# Patient Record
Sex: Male | Born: 1980 | Hispanic: No | Marital: Married | Smoking: Never smoker
Health system: Southern US, Community
[De-identification: ages and names within clinical notes are randomized; demographics above are authoritative.]

## PROBLEM LIST (undated history)

## (undated) HISTORY — PX: SHOULDER SURGERY: SHX246

## (undated) HISTORY — PX: FINGER SURGERY: SHX640

---

## 2017-08-08 ENCOUNTER — Encounter (HOSPITAL_COMMUNITY): Payer: Self-pay | Admitting: Emergency Medicine

## 2017-08-08 ENCOUNTER — Emergency Department (HOSPITAL_COMMUNITY)
Admission: EM | Admit: 2017-08-08 | Discharge: 2017-08-08 | Disposition: A | Discharge status: Home / Self Care | Payer: Self-pay | Attending: Emergency Medicine | Admitting: Emergency Medicine

## 2017-08-08 ENCOUNTER — Emergency Department (HOSPITAL_COMMUNITY): Payer: Self-pay

## 2017-08-08 DIAGNOSIS — R142 Eructation: Secondary | ICD-10-CM | POA: Insufficient documentation

## 2017-08-08 DIAGNOSIS — R0789 Other chest pain: Secondary | ICD-10-CM | POA: Insufficient documentation

## 2017-08-08 LAB — CBC
HCT: 44.5 % (ref 39.0–52.0)
Hemoglobin: 15.4 g/dL (ref 13.0–17.0)
MCH: 30.7 pg (ref 26.0–34.0)
MCHC: 34.6 g/dL (ref 30.0–36.0)
MCV: 88.8 fL (ref 78.0–100.0)
Platelets: 271 10*3/uL (ref 150–400)
RBC: 5.01 MIL/uL (ref 4.22–5.81)
RDW: 13.7 % (ref 11.5–15.5)
WBC: 7.3 10*3/uL (ref 4.0–10.5)

## 2017-08-08 LAB — I-STAT TROPONIN, ED: Troponin i, poc: 0 ng/mL (ref 0.00–0.08)

## 2017-08-08 LAB — BASIC METABOLIC PANEL
Anion gap: 4 — ABNORMAL LOW (ref 5–15)
BUN: 27 mg/dL — ABNORMAL HIGH (ref 6–20)
CO2: 29 mmol/L (ref 22–32)
Calcium: 9.6 mg/dL (ref 8.9–10.3)
Chloride: 105 mmol/L (ref 101–111)
Creatinine, Ser: 1 mg/dL (ref 0.61–1.24)
GFR calc Af Amer: >60 mL/min (ref >60)
GFR calc non Af Amer: >60 mL/min (ref >60)
Glucose, Bld: 101 mg/dL — ABNORMAL HIGH (ref 65–99)
Potassium: 4.3 mmol/L (ref 3.5–5.1)
Sodium: 138 mmol/L (ref 135–145)

## 2017-08-08 MED ORDER — OMEPRAZOLE 20 MG PO CPDR: 20.0000 mg | DELAYED_RELEASE_CAPSULE | Freq: Every day | ORAL | 0 refills | Status: DC

## 2017-08-08 NOTE — ED Notes (Signed)
Discharge instructions reviewed with patient, at request of patient care nurse Apolonio Schneiders. Patient verbalizes understanding. VSS. PIV removed- catheter intact.

## 2017-08-08 NOTE — ED Provider Notes (Signed)
[facility] DEPT Provider Note   CSN: [id number] Arrival date & time: 08/08/17  1030     History   Chief Complaint Chief Complaint  Patient presents with  . Chest Pain    HPI Kevin Black is a 37 y.o. male with no significant past medical history who presents the emergency department today for chest pain.  Patient states that one week ago he was in a heated argument with a neighbor when he started developing a central chest pain without radiation to his back, jaw, neck or shoulders.  He stated the pain was burning in sensation and rated as a 8/10.  He said the pain caused him to hyperventilate from the pain.  He denies any nausea or associated diaphoresis.  Pain lasted approximately 30 minutes before going away.  Since then the patient has been having similar pains in the central chest described as burning without radiation or associated nausea, diaphoresis, or shortness of breath.  These typically occur when the patient is at rest or after periods of eating.  He notes that he has been having episodes of burping and belching associated with this that help with his symptoms.  The pain is nonexertional, nonpleuritic and non-positional. It typically lasts ~45 minutes. Last episode was yesterday night. He has no increased in activity recently.  He has not taken anything for symptoms.  The patient is a never smoker.  He has family history of any early cardiac death.  He has never had a stress test, echocardiogram or heart catheterization done before. He is chest pain free currently.  HPI  History reviewed. No pertinent past medical history.  There are no active problems to display for this patient.   Past Surgical History:  Procedure Laterality Date  . FINGER SURGERY    . SHOULDER SURGERY Right        Home Medications    Prior to Admission medications   Not on File    Family History No family history on file.  Social History Social History    Tobacco Use  . Smoking status: Never Smoker  . Smokeless tobacco: Never Used  Substance Use Topics  . Alcohol use: Yes  . Drug use: Not on file     Allergies   Patient has no known allergies.   Review of Systems Review of Systems  All other systems reviewed and are negative.    Physical Exam Updated Vital Signs BP (!) 146/91 (BP Location: Left Arm)   Pulse 76   Temp 97.9 F (36.6 C) (Oral)   Resp 15   SpO2 100%   Physical Exam  Constitutional: He appears well-developed and well-nourished.  HENT:  Head: Normocephalic and atraumatic.  Right Ear: External ear normal.  Left Ear: External ear normal.  Nose: Nose normal.  Mouth/Throat: Uvula is midline, oropharynx is clear and moist and mucous membranes are normal. No tonsillar exudate.  Eyes: Pupils are equal, round, and reactive to light. Right eye exhibits no discharge. Left eye exhibits no discharge. No scleral icterus.  Neck: Trachea normal. Neck supple. No JVD present. No spinous process tenderness present. Carotid bruit is not present. No neck rigidity. Normal range of motion present.  Cardiovascular: Normal rate, regular rhythm and intact distal pulses.  No murmur heard. Pulses:      Radial pulses are 2+ on the right side, and 2+ on the left side.       Dorsalis pedis pulses are 2+ on the right side, and 2+ on the  left side.       Posterior tibial pulses are 2+ on the right side, and 2+ on the left side.  No lower extremity swelling or edema. Calves symmetric in size bilaterally.  Pulmonary/Chest: Effort normal and breath sounds normal. He exhibits no tenderness.  No chest wall pain or crepitus.   Abdominal: Soft. Bowel sounds are normal. There is no tenderness. There is no rebound and no guarding.  Musculoskeletal: He exhibits no edema.  Lymphadenopathy:    He has no cervical adenopathy.  Neurological: He is alert.  Skin: Skin is warm and dry. No rash noted. He is not diaphoretic.  Psychiatric: He has a  normal mood and affect.  Nursing note and vitals reviewed.    ED Treatments / Results  Labs (all labs ordered are listed, but only abnormal results are displayed) Labs Reviewed  BASIC METABOLIC PANEL - Abnormal; Notable for the following components:      Result Value   Glucose, Bld 101 (*)    BUN 27 (*)    Anion gap 4 (*)    All other components within normal limits  CBC  I-STAT TROPONIN, ED    EKG  EKG Interpretation  Date/Time:  Monday August 08 2017 12:06:50 EST Ventricular Rate:  68 PR Interval:    QRS Duration: 95 QT Interval:  405 QTC Calculation: 431 R Axis:   72 Text Interpretation:  Sinus rhythm Confirmed by Virgel Manifold [phone]) on 08/08/2017 1:27:04 PM       Radiology Dg Chest 2 View  Result Date: 08/08/2017 CLINICAL DATA:  Chest pain EXAM: CHEST  2 VIEW COMPARISON:  None. FINDINGS: Lungs are clear. Heart size and pulmonary vascularity are normal. No adenopathy. No pneumothorax. No bone lesions. IMPRESSION: No edema or consolidation. Electronically Signed   By: Lowella Grip III M.D.   On: 08/08/2017 13:19    Procedures Procedures (including critical care time)  Medications Ordered in ED Medications - No data to display   Initial Impression / Assessment and Plan / ED Course  I have reviewed the triage vital signs and the nursing notes.  Pertinent labs & imaging results that were available during my care of the patient were reviewed by me and considered in my medical decision making (see chart for details).     Patient presented with chest pain to the ED. Patient is to be discharged with recommendation to follow up with PCP in regards to today's hospital visit. Chest pain is not likely of cardiac or pulmonary etiology due to presentation, perc negative, stable vital signs, no tracheal deviation, no JVD or new murmur, RRR, breath sounds equal bilaterally, EKG without acute abnormalities, negative troponin, and negative CXR. HEART score is 1. Patient  has been advised to return to the ED if chest pain becomes exertional, associated with diaphoresis or nausea, radiates to left jaw/arm, worsens or becomes concerning in any way. Patient appears reliable for follow up and is agreeable to discharge. I advised the patient to follow-up with their primary care provider this week. I advised the patient to return to the emergency department with new or worsening symptoms or new concerns. The patient verbalized understanding and agreement with plan. Patient stable   Final Clinical Impressions(s) / ED Diagnoses   Final diagnoses:  Atypical chest pain    ED Discharge Orders    None       Lorelle Gibbs 08/08/17 1330    Virgel Manifold, MD 08/08/17 1630

## 2017-08-08 NOTE — ED Triage Notes (Signed)
Patient reports that 2 days ago he got really anger and yelling at someone and had central chest pains since that have been intermittent. Patient reports that is burning sensations and denies getting worse with movement.

## 2017-08-08 NOTE — Discharge Instructions (Signed)
Read instructions below for reasons to return to the Emergency Department. It is recommended that your follow up with your Primary Care Doctor in regards to today's visit. If you do not have a doctor, use the resource guide listed below to help you find one.  Begin taking over the counter Prilosec or Zegrid (omeprazole) as directed.   Tests performed today include: An EKG of your heart A chest x-ray Cardiac enzymes - a blood test for heart muscle damage Blood counts and electrolytes Vital signs. See below for your results today.   Chest Pain (Nonspecific)  HOME CARE INSTRUCTIONS  For the next few days, avoid physical activities that bring on chest pain. Continue physical activities as directed.  Do not smoke cigarettes or drink alcohol until your symptoms are gone. If you do smoke, it is time to quit. You may receive instructions and counseling on how to stop smoking. Only take over-the-counter or prescription medicine for pain, discomfort, or fever as directed by your caregiver.  Follow your caregiver's suggestions for further testing if your chest pain does not go away.  Keep any follow-up appointments you made. If you do not go to an appointment, you could develop lasting (chronic) problems with pain. If there is any problem keeping an appointment, you must call to reschedule.  SEEK MEDICAL CARE IF:  You think you are having problems from the medicine you are taking. Read your medicine instructions carefully.  Your chest pain does not go away, even after treatment.  You develop a rash with blisters on your chest.  SEEK IMMEDIATE MEDICAL CARE IF:  You have increased chest pain or pain that spreads to your arm, neck, jaw, back, or belly (abdomen).  You develop shortness of breath, an increasing cough, or you are coughing up blood.  You have severe back or abdominal pain, feel sick to your stomach (nauseous) or throw up (vomit).  You develop severe weakness, fainting, or chills.  You have an  oral temperature above 102 F (38.9 C), not controlled by medicine.  THIS IS AN EMERGENCY. Do not wait to see if the pain will go away. Get medical help at once. Call your local emergency services (911 in [place] Do not drive yourself to the hospital. Additional Information:  Your vital signs today were: BP (!) 136/92 (BP Location: Left Arm)    Pulse 75    Temp (!) 97.5 F (36.4 C) (Oral)    Resp 18    Ht 6\' 2"  (1.88 m)    Wt 90.7 kg (200 lb)    SpO2 98%    BMI 25.68 kg/m  If your blood pressure (BP) was elevated above 135/85 this visit, please have this repeated by your doctor within one month. ---------------

## 2018-04-19 ENCOUNTER — Telehealth: Payer: Self-pay | Admitting: Emergency Medicine

## 2018-04-19 NOTE — Telephone Encounter (Signed)
Copied from [facility] [id number]. Topic: Appointment Scheduling - New Patient >> Apr 19, 2018  9:46 AM Mylinda Latina, NT wrote: New patient has been scheduled for your office. Provider: Dr. Ethelene Hal Date of Appointment: 04/25/18  Route to department's PEC pool.

## 2018-04-25 ENCOUNTER — Encounter: Payer: Self-pay | Admitting: Family Medicine

## 2018-04-25 ENCOUNTER — Ambulatory Visit (INDEPENDENT_AMBULATORY_CARE_PROVIDER_SITE_OTHER): Payer: No Typology Code available for payment source | Admitting: Family Medicine

## 2018-04-25 VITALS — BP 124/80 | HR 79 | Ht 74.0 in | Wt 221.1 lb

## 2018-04-25 DIAGNOSIS — Z0000 Encounter for general adult medical examination without abnormal findings: Secondary | ICD-10-CM

## 2018-04-25 HISTORY — DX: Encounter for general adult medical examination without abnormal findings: Z00.00

## 2018-04-25 NOTE — Progress Notes (Signed)
Subjective:  Patient ID: Kevin Black, male    DOB: May 03, 1981  Age: 37 y.o. MRN: [id number]  CC: Establish Care   HPI Kevin Black presents for a physical exam.  He is not fasting today.  Patient does not smoke or use illicit drugs.  He drinks 1 alcoholic drink daily.  Sometimes a few more than that on the weekends.  He lives with his wife and 3 children oldest is 26 and then her youngest is 18.  He works in a store.  Father passed from an acute asthma attack.  Mom has hypertension that is treated.  She has a history of some type of septal defect that she has had from birth.  Patient reports difficulty eating pork and red meat.  There is some stomach pain.  He has no rash or swelling from this.  Denies ongoing reflux symptoms.  Kevin Black does not bother him but other spicy or cuts pork do.  He has essentially removed most pork and red meat from his diet.  History Kevin Black has no past medical history on file.   He has a past surgical history that includes Shoulder surgery (Right) and Finger surgery.   His family history is not on file.He reports that he has never smoked. He has never used smokeless tobacco. He reports that he drinks alcohol. His drug history is not on file.  Outpatient Medications Prior to Visit  Medication Sig Dispense Refill  . aspirin 325 MG tablet Take 325 mg by mouth every 4 (four) hours as needed for moderate pain.    Marland Kitchen omeprazole (PRILOSEC) 20 MG capsule Take 1 capsule (20 mg total) by mouth daily. 30 capsule 0   No facility-administered medications prior to visit.     ROS Review of Systems  Constitutional: Negative for chills, diaphoresis, fatigue, fever and unexpected weight change.  HENT: Negative.   Eyes: Negative for photophobia and visual disturbance.  Respiratory: Negative.   Cardiovascular: Negative.   Gastrointestinal: Negative.   Endocrine: Negative for polyphagia and polyuria.  Genitourinary: Positive for decreased urine volume. Negative for difficulty  urinating and hematuria.  Musculoskeletal: Negative for arthralgias and myalgias.  Skin: Negative for pallor.  Allergic/Immunologic: Negative for immunocompromised state.  Neurological: Negative for headaches.  Hematological: Does not bruise/bleed easily.  Psychiatric/Behavioral: Negative.     Objective:  BP 124/80   Pulse 79   Ht 6\' 2"  (1.88 m)   Wt 221 lb 2 oz (100.3 kg)   SpO2 97%   BMI 28.39 kg/m   Physical Exam  Constitutional: He is oriented to person, place, and time. He appears well-developed and well-nourished. No distress.  HENT:  Head: Normocephalic and atraumatic.  Right Ear: External ear normal.  Left Ear: External ear normal.  Mouth/Throat: No oropharyngeal exudate.  Eyes: Pupils are equal, round, and reactive to light. Conjunctivae and EOM are normal. Right eye exhibits no discharge. Left eye exhibits no discharge. No scleral icterus.  Neck: No JVD present. No tracheal deviation present. No thyromegaly present.  Cardiovascular: Normal rate, regular rhythm and normal heart sounds.  Pulmonary/Chest: Effort normal and breath sounds normal.  Abdominal: Soft. Bowel sounds are normal. He exhibits no distension and no mass. There is no tenderness. There is no guarding. Hernia confirmed negative in the right inguinal area and confirmed negative in the left inguinal area.  Genitourinary: Testes normal and penis normal. Right testis shows no mass, no swelling and no tenderness. Right testis is descended. Left testis shows no mass, no swelling and  no tenderness. Left testis is descended. Circumcised. No hypospadias, penile erythema or penile tenderness. No discharge found.  Musculoskeletal: He exhibits no edema.  Lymphadenopathy:    He has no cervical adenopathy. No inguinal adenopathy noted on the right or left side.  Neurological: He is alert and oriented to person, place, and time.  Skin: Skin is warm and dry. He is not diaphoretic. No erythema.      Assessment & Plan:     Abdul was seen today for establish care.  Diagnoses and all orders for this visit:  Healthcare maintenance -     CBC; Future -     Comprehensive metabolic panel; Future -     HIV Antibody (routine testing w rflx); Future -     Lipid panel; Future -     Urinalysis, Routine w reflex microscopic; Future   I have discontinued Daytona Bonfanti's aspirin and omeprazole.  No orders of the defined types were placed in this encounter.    Follow-up: Return don't forget to return fasting for blood work.Libby Maw, MD

## 2018-04-27 ENCOUNTER — Other Ambulatory Visit (INDEPENDENT_AMBULATORY_CARE_PROVIDER_SITE_OTHER): Payer: No Typology Code available for payment source

## 2018-04-27 DIAGNOSIS — Z0000 Encounter for general adult medical examination without abnormal findings: Secondary | ICD-10-CM

## 2018-04-27 LAB — CBC
HEMATOCRIT: 45.3 % (ref 39.0–52.0)
HEMOGLOBIN: 15.4 g/dL (ref 13.0–17.0)
MCHC: 34.1 g/dL (ref 30.0–36.0)
MCV: 90.1 fl (ref 78.0–100.0)
Platelets: 300 10*3/uL (ref 150.0–400.0)
RBC: 5.02 Mil/uL (ref 4.22–5.81)
RDW: 13.6 % (ref 11.5–15.5)
WBC: 7.5 10*3/uL (ref 4.0–10.5)

## 2018-04-27 LAB — LIPID PANEL
CHOLESTEROL: 222 mg/dL — AB (ref 0–200)
HDL: 62.1 mg/dL (ref >39.00)
LDL CALC: 141 mg/dL — AB (ref 0–99)
NonHDL: 159.83
TRIGLYCERIDES: 94 mg/dL (ref 0.0–149.0)
Total CHOL/HDL Ratio: 4
VLDL: 18.8 mg/dL (ref 0.0–40.0)

## 2018-04-27 LAB — URINALYSIS, ROUTINE W REFLEX MICROSCOPIC
Bilirubin Urine: NEGATIVE
Hgb urine dipstick: NEGATIVE
Ketones, ur: NEGATIVE
LEUKOCYTES UA: NEGATIVE
NITRITE: NEGATIVE
SPECIFIC GRAVITY, URINE: 1.02 (ref 1.000–1.030)
Total Protein, Urine: NEGATIVE
Urine Glucose: NEGATIVE
Urobilinogen, UA: 0.2 (ref 0.0–1.0)
WBC UA: NONE SEEN — AB (ref 0–?)
pH: 6.5 (ref 5.0–8.0)

## 2018-04-27 LAB — COMPREHENSIVE METABOLIC PANEL
ALBUMIN: 4.5 g/dL (ref 3.5–5.2)
ALT: 36 U/L (ref 0–53)
AST: 25 U/L (ref 0–37)
Alkaline Phosphatase: 69 U/L (ref 39–117)
BUN: 13 mg/dL (ref 6–23)
CALCIUM: 9.7 mg/dL (ref 8.4–10.5)
CHLORIDE: 102 meq/L (ref 96–112)
CO2: 31 mEq/L (ref 19–32)
CREATININE: 1.02 mg/dL (ref 0.40–1.50)
GFR: 86.54 mL/min (ref >60.00)
Glucose, Bld: 98 mg/dL (ref 70–99)
POTASSIUM: 3.9 meq/L (ref 3.5–5.1)
Sodium: 137 mEq/L (ref 135–145)
Total Bilirubin: 0.7 mg/dL (ref 0.2–1.2)
Total Protein: 7.8 g/dL (ref 6.0–8.3)

## 2018-04-28 LAB — HIV ANTIBODY (ROUTINE TESTING W REFLEX): HIV 1&2 Ab, 4th Generation: NONREACTIVE

## 2018-08-21 ENCOUNTER — Encounter: Payer: Self-pay | Admitting: Family Medicine

## 2018-08-21 ENCOUNTER — Ambulatory Visit (INDEPENDENT_AMBULATORY_CARE_PROVIDER_SITE_OTHER): Payer: No Typology Code available for payment source | Admitting: Family Medicine

## 2018-08-21 VITALS — BP 120/70 | HR 74 | Ht 74.0 in | Wt 218.0 lb

## 2018-08-21 DIAGNOSIS — D229 Melanocytic nevi, unspecified: Secondary | ICD-10-CM

## 2018-08-21 DIAGNOSIS — E7800 Pure hypercholesterolemia, unspecified: Secondary | ICD-10-CM

## 2018-08-21 DIAGNOSIS — R319 Hematuria, unspecified: Secondary | ICD-10-CM

## 2018-08-21 DIAGNOSIS — Z789 Other specified health status: Secondary | ICD-10-CM

## 2018-08-21 DIAGNOSIS — F1090 Alcohol use, unspecified, uncomplicated: Secondary | ICD-10-CM

## 2018-08-21 DIAGNOSIS — B360 Pityriasis versicolor: Secondary | ICD-10-CM

## 2018-08-21 DIAGNOSIS — Z7289 Other problems related to lifestyle: Secondary | ICD-10-CM | POA: Diagnosis not present

## 2018-08-21 HISTORY — DX: Pure hypercholesterolemia, unspecified: E78.00

## 2018-08-21 HISTORY — DX: Pityriasis versicolor: B36.0

## 2018-08-21 HISTORY — DX: Alcohol use, unspecified, uncomplicated: F10.90

## 2018-08-21 HISTORY — DX: Other specified health status: Z78.9

## 2018-08-21 HISTORY — DX: Melanocytic nevi, unspecified: D22.9

## 2018-08-21 HISTORY — DX: Hematuria, unspecified: R31.9

## 2018-08-21 LAB — LIPID PANEL
CHOLESTEROL: 234 mg/dL — AB (ref 0–200)
HDL: 56.3 mg/dL (ref >39.00)
LDL CALC: 165 mg/dL — AB (ref 0–99)
NonHDL: 177.4
Total CHOL/HDL Ratio: 4
Triglycerides: 63 mg/dL (ref 0.0–149.0)
VLDL: 12.6 mg/dL (ref 0.0–40.0)

## 2018-08-21 LAB — URINALYSIS, ROUTINE W REFLEX MICROSCOPIC
HGB URINE DIPSTICK: NEGATIVE
Ketones, ur: NEGATIVE
NITRITE: NEGATIVE
RBC / HPF: NONE SEEN (ref 0–?)
Specific Gravity, Urine: 1.025 (ref 1.000–1.030)
Total Protein, Urine: NEGATIVE
Urine Glucose: NEGATIVE
Urobilinogen, UA: 0.2 (ref 0.0–1.0)
pH: 6 (ref 5.0–8.0)

## 2018-08-21 LAB — CBC
HCT: 45.4 % (ref 39.0–52.0)
HEMOGLOBIN: 15.6 g/dL (ref 13.0–17.0)
MCHC: 34.4 g/dL (ref 30.0–36.0)
MCV: 88.8 fl (ref 78.0–100.0)
PLATELETS: 307 10*3/uL (ref 150.0–400.0)
RBC: 5.11 Mil/uL (ref 4.22–5.81)
RDW: 13.7 % (ref 11.5–15.5)
WBC: 7.2 10*3/uL (ref 4.0–10.5)

## 2018-08-21 LAB — HEPATIC FUNCTION PANEL
ALBUMIN: 4.5 g/dL (ref 3.5–5.2)
ALK PHOS: 75 U/L (ref 39–117)
ALT: 68 U/L — ABNORMAL HIGH (ref 0–53)
AST: 36 U/L (ref 0–37)
BILIRUBIN DIRECT: 0.1 mg/dL (ref 0.0–0.3)
Total Bilirubin: 0.6 mg/dL (ref 0.2–1.2)
Total Protein: 7.4 g/dL (ref 6.0–8.3)

## 2018-08-21 LAB — GAMMA GT: GGT: 45 U/L (ref 7–51)

## 2018-08-21 MED ORDER — SELENIUM SULFIDE 2.5 % EX LOTN: TOPICAL_LOTION | CUTANEOUS | 5 refills | Status: DC

## 2018-08-21 NOTE — Patient Instructions (Signed)
Preventing High Cholesterol  Cholesterol is a waxy, fat-like substance that your body needs in small amounts. Your liver makes all the cholesterol that your body needs. Having high cholesterol (hypercholesterolemia) increases your risk for heart disease and stroke. Extra (excess) cholesterol comes from the food you eat, such as animal-based fat (saturated fat) from meat and some dairy products.  High cholesterol can often be prevented with diet and lifestyle changes. If you already have high cholesterol, you can control it with diet and lifestyle changes, as well as medicine.  What nutrition changes can be made?   Eat less saturated fat. Foods that contain saturated fat include red meat and some dairy products.   Avoid processed meats, like bacon and lunch meats.   Avoid trans fats, which are found in margarine and some baked goods.   Avoid foods and beverages that have added sugars.   Eat more fruits, vegetables, and whole grains.   Choose healthy sources of protein, such as fish, poultry, and nuts.   Choose healthy sources of fat, such as:  ? Nuts.  ? Vegetable oils, especially olive oil.  ? Fish that have healthy fats (omega-3 fatty acids), such as mackerel or salmon.  What lifestyle changes can be made?     Lose weight if you are overweight. Losing 5-10 lb (2.3-4.5 kg) can help prevent or control high cholesterol and reduce your risk for diabetes and high blood pressure. Ask your health care provider to help you with a diet and exercise plan to safely lose weight.   Get enough exercise. Do at least 150 minutes of moderate-intensity exercise each week.  ? You could do this in short exercise sessions several times a day, or you could do longer exercise sessions a few times a week. For example, you could take a brisk 10-minute walk or bike ride, 3 times a day, for 5 days a week.   Do not smoke. If you need help quitting, ask your health care provider.   Limit your alcohol intake. If you drink  alcohol, limit alcohol intake to no more than 1 drink a day for nonpregnant women and 2 drinks a day for men. One drink equals 12 oz of beer, 5 oz of wine, or 1 oz of hard liquor.  Why are these changes important?    If you have high cholesterol, deposits (plaques) may build up on the walls of your blood vessels. Plaques make the arteries narrower and stiffer, which can restrict or block blood flow and cause blood clots to form. This greatly increases your risk for heart attack and stroke. Making diet and lifestyle changes can reduce your risk for these life-threatening conditions.  What can I do to lower my risk?   Manage your risk factors for high cholesterol. Talk with your health care provider about all of your risk factors and how to lower your risk.   Manage other conditions that you have, such as diabetes or high blood pressure (hypertension).   Have your cholesterol checked at regular intervals.   Keep all follow-up visits as told by your health care provider. This is important.  How is this treated?  In addition to diet and lifestyle changes, your health care provider may recommend medicines to help lower cholesterol, such as a medicine to reduce the amount of cholesterol made in your liver. You may need medicine if:   Diet and lifestyle changes do not lower your cholesterol enough.   You have high cholesterol and other risk factors   and Blood Institute: [email or web address] Summary  High cholesterol increases your risk for heart disease and stroke. By keeping your cholesterol level low, you can reduce your risk for these conditions.  Diet and lifestyle changes are  the most important steps in preventing high cholesterol.  Work with your health care provider to manage your risk factors, and have your blood tested regularly. This information is not intended to replace advice given to you by your health care provider. Make sure you discuss any questions you have with your health care provider. Document Released: 07/27/2015 Document Revised: 03/20/2016 Document Reviewed: 03/20/2016 Elsevier Interactive Patient Education  2019 [place] versicolor is a common fungal infection of the skin. It causes a rash that appears as light or dark patches on the skin. The rash most often occurs on the chest, back, neck, or upper arms. This condition is more common during warm weather. Other than affecting how your skin looks, tinea versicolor usually does not cause other problems. In most cases, the infection goes away in a few weeks with treatment. It may take a few months for the patches on your skin to return to your usual skin color. What are the causes? This condition occurs when a type of fungus that is normally present on the skin starts to overgrow. This fungus is a kind of yeast. The exact cause of the overgrowth is not known. This condition cannot be passed from one person to another (it is not contagious). What increases the risk? This condition is more likely to develop when certain factors are present, such as:  Heat and humidity.  Sweating too much.  Hormone changes.  Oily skin.  A weak disease-fighting system (immunesystem). What are the signs or symptoms? Symptoms of this condition include:  A rash of light or dark patches on your skin. The rash may have: ? Patches of tan or pink spots (on light skin). ? Patches of white or brown spots (on dark skin). ? Patches of skin that do not tan. ? Well-marked edges. ? Scales on the discolored areas.  Mild itching. How is this diagnosed? A health care provider can usually  diagnose this condition by looking at your skin. During the exam, he or she may use ultraviolet (UV) light to see how much of your skin has been affected. In some cases, a skin sample may be taken by scraping the rash. This sample will be viewed under a microscope to check for yeast overgrowth. How is this treated? Treatment for this condition may include:  Dandruff shampoo that is applied to the affected skin during showers or bathing.  Over-the-counter medicated skin cream, lotion, or soaps.  Prescription antifungal medicine in the form of skin cream or pills.  Medicine to help reduce itching. Follow these instructions at home:  Take over-the-counter and prescription medicines only as told by your health care provider.  Apply dandruff shampoo to the affected area if your health care provider told you to do that. You may be instructed to scrub the affected skin for several minutes each day.  Do not scratch the affected area of skin.  Avoid hot and humid conditions.  Do not use tanning booths.  Try to avoid sweating a lot. Contact a health care provider if:  Your symptoms get worse.  You have a fever.  You have redness, swelling, or pain at the site of your rash.  You have fluid or blood coming from your rash.  Your rash feels warm  to the touch.  You have pus or a bad smell coming from your rash.  Your rash returns (recurs) after treatment. Summary  Tinea versicolor is a common fungal infection of the skin. It causes a rash that appears as light or dark patches on the skin.  The rash most often occurs on the chest, back, neck, or upper arms.  A health care provider can usually diagnose this condition by looking at your skin.  Treatment may include applying shampoo to the skin and taking or applying medicines. This information is not intended to replace advice given to you by your health care provider. Make sure you discuss any questions you have with your health care  provider. Document Released: 07/09/2000 Document Revised: 03/15/2017 Document Reviewed: 03/15/2017 Elsevier Interactive Patient Education  2019 [employer]  Alcohol Use Disorder Alcohol use disorder is when your drinking disrupts your daily life. When you have this condition, you drink too much alcohol and you cannot control your drinking. Alcohol use disorder can cause serious problems with your physical health. It can affect your brain, heart, liver, pancreas, immune system, stomach, and intestines. Alcohol use disorder can increase your risk for certain cancers and cause problems with your mental health, such as depression, anxiety, psychosis, delirium, and dementia. People with this disorder risk hurting themselves and others. What are the causes? This condition is caused by drinking too much alcohol over time. It is not caused by drinking too much alcohol only one or two times. Some people with this condition drink alcohol to cope with or escape from negative life events. Others drink to relieve pain or symptoms of mental illness. What increases the risk? You are more likely to develop this condition if:  You have a family history of alcohol use disorder.  Your culture encourages drinking to the point of intoxication, or makes alcohol easy to get.  You had a mood or conduct disorder in childhood.  You have been a victim of abuse.  You are an adolescent and: ? You have poor grades or difficulties in school. ? Your caregivers do not talk to you about saying no to alcohol, or supervise your activities. ? You are impulsive or you have trouble with self-control. What are the signs or symptoms? Symptoms of this condition include:  Drinkingmore than you want to.  Drinking for longer than you want to.  Trying several times to drink less or to control your drinking.  Spending a lot of time getting alcohol, drinking, or recovering from drinking.  Craving alcohol.  Having problems  at work, at school, or at home due to drinking.  Having problems in relationships due to drinking.  Drinking when it is dangerous to drink, such as before driving a car.  Continuing to drink even though you know you might have a physical or mental problem related to drinking.  Needing more and more alcohol to get the same effect you want from the alcohol (building up tolerance).  Having symptoms of withdrawal when you stop drinking. Symptoms of withdrawal include: ? Fatigue. ? Nightmares. ? Trouble sleeping. ? Depression. ? Anxiety. ? Fever. ? Seizures. ? Severe confusion. ? Feeling or seeing things that are not there (hallucinations). ? Tremors. ? Rapid heart rate. ? Rapid breathing. ? High blood pressure.  Drinking to avoid symptoms of withdrawal. How is this diagnosed? This condition is diagnosed with an assessment. Your health care provider may start the assessment by asking three or four questions about your drinking. Your health care  provider may perform a physical exam or do lab tests to see if you have physical problems resulting from alcohol use. She or he may refer you to a mental health professional for evaluation. How is this treated? Some people with alcohol use disorder are able to reduce their alcohol use to low-risk levels. Others need to completely quit drinking alcohol. When necessary, mental health professionals with specialized training in substance use treatment can help. Your health care provider can help you decide how severe your alcohol use disorder is and what type of treatment you need. The following forms of treatment are available:  Detoxification. Detoxification involves quitting drinking and using prescription medicines within the first week to help lessen withdrawal symptoms. This treatment is important for people who have had withdrawal symptoms before and for heavy drinkers who are likely to have withdrawal symptoms. Alcohol withdrawal can be  dangerous, and in severe cases, it can cause death. Detoxification may be provided in a home, community, or primary care setting, or in a hospital or substance use treatment facility.  Counseling. This treatment is also called talk therapy. It is provided by substance use treatment counselors. A counselor can address the reasons you use alcohol and suggest ways to keep you from drinking again or to prevent problem drinking. The goals of talk therapy are to: ? Find healthy activities and ways for you to cope with stress. ? Identify and avoid the things that trigger your alcohol use. ? Help you learn how to handle cravings.  Medicines.Medicines can help treat alcohol use disorder by: ? Decreasing alcohol cravings. ? Decreasing the positive feeling you have when you drink alcohol. ? Causing an uncomfortable physical reaction when you drink alcohol (aversion therapy).  Support groups. Support groups are led by people who have quit drinking. They provide emotional support, advice, and guidance. These forms of treatment are often combined. Some people with this condition benefit from a combination of treatments provided by specialized substance use treatment centers. Follow these instructions at home:  Take over-the-counter and prescription medicines only as told by your health care provider.  Check with your health care provider before starting any new medicines.  Ask friends and family members not to offer you alcohol.  Avoid situations where alcohol is served, including gatherings where others are drinking alcohol.  Create a plan for what to do when you are tempted to use alcohol.  Find hobbies or activities that you enjoy that do not include alcohol.  Keep all follow-up visits as told by your health care provider. This is important. How is this prevented?  If you drink, limit alcohol intake to no more than 1 drink a day for nonpregnant women and 2 drinks a day for men. One drink equals  12 oz of beer, 5 oz of wine, or 1 oz of hard liquor.  If you have a mental health condition, get treatment and support.  Do not give alcohol to adolescents.  If you are an adolescent: ? Do not drink alcohol. ? Do not be afraid to say no if someone offers you alcohol. Speak up about why you do not want to drink. You can be a positive role model for your friends and set a good example for those around you by not drinking alcohol. ? If your friends drink, spend time with others who do not drink alcohol. Make new friends who do not use alcohol. ? Find healthy ways to manage stress and emotions, such as meditation or deep breathing, exercise,  spending time in nature, listening to music, or talking with a trusted friend or family member. Contact a health care provider if:  You are not able to take your medicines as told.  Your symptoms get worse.  You return to drinking alcohol (relapse) and your symptoms get worse. Get help right away if:  You have thoughts about hurting yourself or others. If you ever feel like you may hurt yourself or others, or have thoughts about taking your own life, get help right away. You can go to your nearest emergency department or call:  Your local emergency services (911 in the [place]  A suicide crisis helpline, such as the [place] at 351-368-0637. This is open 24 hours a day. Summary  Alcohol use disorder is when your drinking disrupts your daily life. When you have this condition, you drink too much alcohol and you cannot control your drinking.  Treatment may include detoxification, counseling, medicine, and support groups.  Ask friends and family members not to offer you alcohol. Avoid situations where alcohol is served.  Get help right away if you have thoughts about hurting yourself or others. This information is not intended to replace advice given to you by your health care provider. Make sure you discuss any questions  you have with your health care provider. Document Released: 08/19/2004 Document Revised: 04/08/2016 Document Reviewed: 04/08/2016 Elsevier Interactive Patient Education  [employer].

## 2018-08-21 NOTE — Progress Notes (Signed)
Established Patient Office Visit  Subjective:  Patient ID: Kevin Black, male    DOB: September 05, 1980  Age: 38 y.o. MRN: [id number]  CC:  Chief Complaint  Patient presents with  . Hematuria    HPI Kevin Black presents for for concern about a darkening of his urine that he experienced this past week.  He went on to mid that he had been drinking heavily prior to this change in his urine.  He has not had anything to drink for a week.  Both he and his wife have been concerned about his drinking.  Does admit to eye-opener's but denies early morning shakes.  He tends to drink more heavily when he is off.  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 3/5 with [employer].  Denies a family history of alcoholism.  He does not smoke or use illicit drugs.  He has been decreasing amount of cholesterol in his diet.  He is fasting this morning.  He has a mole on his posterior left shoulder that he wants me to take a look at.  History reviewed. No pertinent past medical history.  Past Surgical History:  Procedure Laterality Date  . FINGER SURGERY    . SHOULDER SURGERY Right     History reviewed. No pertinent family history.  Social History   Socioeconomic History  . Marital status: Married    Spouse name: Not on file  . Number of children: Not on file  . Years of education: Not on file  . Highest education level: Not on file  Occupational History  . Not on file  Social Needs  . Financial resource strain: Not on file  . Food insecurity:    Worry: Not on file    Inability: Not on file  . Transportation needs:    Medical: Not on file    Non-medical: Not on file  Tobacco Use  . Smoking status: Never Smoker  . Smokeless tobacco: Never Used  Substance and Sexual Activity  . Alcohol use: Yes    Comment: one drink daily.  . Drug use: Not on file  . Sexual activity: Not on file  Lifestyle  . Physical activity:    Days per week: Not on file    Minutes per session: Not on file  . Stress: Not on file   Relationships  . Social connections:    Talks on phone: Not on file    Gets together: Not on file    Attends religious service: Not on file    Active member of club or organization: Not on file    Attends meetings of clubs or organizations: Not on file    Relationship status: Not on file  . Intimate partner violence:    Fear of current or ex partner: Not on file    Emotionally abused: Not on file    Physically abused: Not on file    Forced sexual activity: Not on file  Other Topics Concern  . Not on file  Social History Narrative  . Not on file    No outpatient medications prior to visit.   No facility-administered medications prior to visit.     No Known Allergies  ROS Review of Systems  Constitutional: Negative for chills, diaphoresis, fatigue, fever and unexpected weight change.  HENT: Negative.   Eyes: Negative.   Respiratory: Negative.   Cardiovascular: Negative.   Gastrointestinal: Negative for abdominal distention, abdominal pain, anal bleeding, blood in stool, nausea and vomiting.  Endocrine: Negative for polyphagia  and polyuria.  Genitourinary: Negative for decreased urine volume, discharge and dysuria.  Musculoskeletal: Negative for gait problem and joint swelling.  Skin: Positive for color change.  Allergic/Immunologic: Negative for immunocompromised state.  Neurological: Negative for tremors, seizures and weakness.  Hematological: Does not bruise/bleed easily.      Objective:    Physical Exam  Constitutional: He is oriented to person, place, and time. He appears well-developed and well-nourished. No distress.  HENT:  Head: Normocephalic and atraumatic.  Right Ear: External ear normal.  Left Ear: External ear normal.  Mouth/Throat: Oropharynx is clear and moist. No oropharyngeal exudate.  Eyes: Pupils are equal, round, and reactive to light. Conjunctivae are normal. Right eye exhibits no discharge. Left eye exhibits no discharge. No scleral icterus.    Neck: Neck supple. No JVD present. No tracheal deviation present. No thyromegaly present.  Cardiovascular: Normal rate, regular rhythm and normal heart sounds.  Pulmonary/Chest: Effort normal and breath sounds normal. No stridor.  Abdominal: Soft. Bowel sounds are normal. He exhibits no distension. There is no abdominal tenderness. There is no rebound and no guarding.  Lymphadenopathy:    He has no cervical adenopathy.  Neurological: He is alert and oriented to person, place, and time.  Skin: Skin is warm and dry. Rash noted. He is not diaphoretic. No pallor.     Psychiatric: He has a normal mood and affect. His behavior is normal.    BP 120/70   Pulse 74   Ht 6\' 2"  (1.88 m)   Wt 218 lb (98.9 kg)   SpO2 95%   BMI 27.99 kg/m  Wt Readings from Last 3 Encounters:  08/21/18 218 lb (98.9 kg)  04/25/18 221 lb 2 oz (100.3 kg)  08/08/17 200 lb (90.7 kg)   BP Readings from Last 3 Encounters:  08/21/18 120/70  04/25/18 124/80  08/08/17 (!) 158/86   Guideline developer:  UpToDate (see UpToDate for funding source) Date Released: June 2014  Health Maintenance Due  Topic Date Due  . Samul Dada  07/31/1998  . INFLUENZA VACCINE  02/23/2018    There are no preventive care reminders to display for this patient.  No results found for: TSH Lab Results  Component Value Date   WBC 7.5 04/27/2018   HGB 15.4 04/27/2018   HCT 45.3 04/27/2018   MCV 90.1 04/27/2018   PLT 300.0 04/27/2018   Lab Results  Component Value Date   NA 137 04/27/2018   K 3.9 04/27/2018   CO2 31 04/27/2018   GLUCOSE 98 04/27/2018   BUN 13 04/27/2018   CREATININE 1.02 04/27/2018   BILITOT 0.7 04/27/2018   ALKPHOS 69 04/27/2018   AST 25 04/27/2018   ALT 36 04/27/2018   PROT 7.8 04/27/2018   ALBUMIN 4.5 04/27/2018   CALCIUM 9.7 04/27/2018   ANIONGAP 4 (L) 08/08/2017   GFR 86.54 04/27/2018   Lab Results  Component Value Date   CHOL 222 (H) 04/27/2018   Lab Results  Component Value Date   HDL  62.10 04/27/2018   Lab Results  Component Value Date   LDLCALC 141 (H) 04/27/2018   Lab Results  Component Value Date   TRIG 94.0 04/27/2018   Lab Results  Component Value Date   CHOLHDL 4 04/27/2018   No results found for: HGBA1C    Assessment & Plan:   Problem List Items Addressed This Visit      Musculoskeletal and Integument   Tinea versicolor   Relevant Medications   selenium sulfide (SELSUN) 2.5 %  shampoo   Atypical nevus   Relevant Orders   Ambulatory referral to Dermatology     Other   Hematuria - Primary   Relevant Orders   Urinalysis, Routine w reflex microscopic   Alcohol use   Relevant Orders   Gamma GT   Urinalysis, Routine w reflex microscopic   Hepatic function panel   CBC   Elevated LDL cholesterol level   Relevant Orders   Lipid panel      Meds ordered this encounter  Medications  . selenium sulfide (SELSUN) 2.5 % shampoo    Sig: Apply daily to rash and leave on for 1 hour and then wash off for one week.    Dispense:  118 mL    Refill:  5    Follow-up: Return in about 2 months (around 10/20/2018).   Follow-up of elevated LDL cholesterol today.  Patient was also given information on lowering the fat and cholesterol in his diet.  Will use prescription strength selenium sulfide lotion for treatment of his skin rash.  Patient agrees to stop drinking for the next few months and follow-up with me 2 months as well.

## 2018-10-18 ENCOUNTER — Encounter: Payer: Self-pay | Admitting: Family Medicine

## 2018-10-23 ENCOUNTER — Ambulatory Visit: Payer: No Typology Code available for payment source | Admitting: Family Medicine

## 2018-12-25 ENCOUNTER — Encounter: Payer: Self-pay | Admitting: Family Medicine

## 2018-12-25 ENCOUNTER — Encounter: Payer: No Typology Code available for payment source | Admitting: Family Medicine

## 2018-12-25 NOTE — Progress Notes (Deleted)
Virtual Visit via Video Note  I connected with Kevin Black on 12/25/18 at  9:30 AM EDT by a video enabled telemedicine application and verified that I am speaking with the correct person using two identifiers.  Location: Patient: *** Provider: ***   I discussed the limitations of evaluation and management by telemedicine and the availability of in person appointments. The patient expressed understanding and agreed to proceed.  History of Present Illness:    Observations/Objective:   Assessment and Plan:   Follow Up Instructions:    I discussed the assessment and treatment plan with the patient. The patient was provided an opportunity to ask questions and all were answered. The patient agreed with the plan and demonstrated an understanding of the instructions.   The patient was advised to call back or seek an in-person evaluation if the symptoms worsen or if the condition fails to improve as anticipated.  I provided *** minutes of non-face-to-face time during this encounter.

## 2019-04-09 ENCOUNTER — Other Ambulatory Visit: Payer: Self-pay

## 2019-04-10 ENCOUNTER — Encounter: Payer: Self-pay | Admitting: Family Medicine

## 2019-04-10 ENCOUNTER — Ambulatory Visit (INDEPENDENT_AMBULATORY_CARE_PROVIDER_SITE_OTHER): Payer: No Typology Code available for payment source | Admitting: Family Medicine

## 2019-04-10 VITALS — BP 124/70 | HR 84 | Ht 74.0 in | Wt 225.4 lb

## 2019-04-10 DIAGNOSIS — R319 Hematuria, unspecified: Secondary | ICD-10-CM | POA: Diagnosis not present

## 2019-04-10 DIAGNOSIS — E7800 Pure hypercholesterolemia, unspecified: Secondary | ICD-10-CM | POA: Diagnosis not present

## 2019-04-10 DIAGNOSIS — R748 Abnormal levels of other serum enzymes: Secondary | ICD-10-CM

## 2019-04-10 DIAGNOSIS — K5901 Slow transit constipation: Secondary | ICD-10-CM

## 2019-04-10 HISTORY — DX: Slow transit constipation: K59.01

## 2019-04-10 HISTORY — DX: Abnormal levels of other serum enzymes: R74.8

## 2019-04-10 LAB — LIPID PANEL
Cholesterol: 247 mg/dL — ABNORMAL HIGH (ref 0–200)
HDL: 57.3 mg/dL (ref >39.00)
LDL Cholesterol: 160 mg/dL — ABNORMAL HIGH (ref 0–99)
NonHDL: 189.83
Total CHOL/HDL Ratio: 4
Triglycerides: 147 mg/dL (ref 0.0–149.0)
VLDL: 29.4 mg/dL (ref 0.0–40.0)

## 2019-04-10 LAB — LDL CHOLESTEROL, DIRECT: Direct LDL: 168 mg/dL

## 2019-04-10 LAB — GAMMA GT: GGT: 47 U/L (ref 7–51)

## 2019-04-10 LAB — COMPREHENSIVE METABOLIC PANEL
ALT: 42 U/L (ref 0–53)
AST: 26 U/L (ref 0–37)
Albumin: 4.4 g/dL (ref 3.5–5.2)
Alkaline Phosphatase: 73 U/L (ref 39–117)
BUN: 16 mg/dL (ref 6–23)
CO2: 30 mEq/L (ref 19–32)
Calcium: 9.8 mg/dL (ref 8.4–10.5)
Chloride: 102 mEq/L (ref 96–112)
Creatinine, Ser: 0.92 mg/dL (ref 0.40–1.50)
GFR: 91.26 mL/min (ref >60.00)
Glucose, Bld: 96 mg/dL (ref 70–99)
Potassium: 4.3 mEq/L (ref 3.5–5.1)
Sodium: 138 mEq/L (ref 135–145)
Total Bilirubin: 0.4 mg/dL (ref 0.2–1.2)
Total Protein: 7.3 g/dL (ref 6.0–8.3)

## 2019-04-10 LAB — URINALYSIS, ROUTINE W REFLEX MICROSCOPIC
Bilirubin Urine: NEGATIVE
Hgb urine dipstick: NEGATIVE
Ketones, ur: NEGATIVE
Leukocytes,Ua: NEGATIVE
Nitrite: NEGATIVE
RBC / HPF: NONE SEEN (ref 0–?)
Specific Gravity, Urine: 1.025 (ref 1.000–1.030)
Total Protein, Urine: NEGATIVE
Urine Glucose: NEGATIVE
Urobilinogen, UA: 0.2 (ref 0.0–1.0)
pH: 6 (ref 5.0–8.0)

## 2019-04-10 MED ORDER — PSYLLIUM 25 % PO POWD: 30.0000 mL | Freq: Every day | ORAL | 2 refills | Status: DC | PRN

## 2019-04-10 NOTE — Patient Instructions (Signed)
Constipation, Adult Constipation is when a person has fewer bowel movements in a week than normal, has difficulty having a bowel movement, or has stools that are dry, hard, or larger than normal. Constipation may be caused by an underlying condition. It may become worse with age if a person takes certain medicines and does not take in enough fluids. Follow these instructions at home: Eating and drinking   Eat foods that have a lot of fiber, such as fresh fruits and vegetables, whole grains, and beans.  Limit foods that are high in fat, low in fiber, or overly processed, such as french fries, hamburgers, cookies, candies, and soda.  Drink enough fluid to keep your urine clear or pale yellow. General instructions  Exercise regularly or as told by your health care provider.  Go to the restroom when you have the urge to go. Do not hold it in.  Take over-the-counter and prescription medicines only as told by your health care provider. These include any fiber supplements.  Practice pelvic floor retraining exercises, such as deep breathing while relaxing the lower abdomen and pelvic floor relaxation during bowel movements.  Watch your condition for any changes.  Keep all follow-up visits as told by your health care provider. This is important. Contact a health care provider if:  You have pain that gets worse.  You have a fever.  You do not have a bowel movement after 4 days.  You vomit.  You are not hungry.  You lose weight.  You are bleeding from the anus.  You have thin, pencil-like stools. Get help right away if:  You have a fever and your symptoms suddenly get worse.  You leak stool or have blood in your stool.  Your abdomen is bloated.  You have severe pain in your abdomen.  You feel dizzy or you faint. This information is not intended to replace advice given to you by your health care provider. Make sure you discuss any questions you have with your health care  provider. Document Released: 04/09/2004 Document Revised: 06/24/2017 Document Reviewed: 12/31/2015 Elsevier Patient Education  2020 [place].  Preventing High Cholesterol Cholesterol is a white, waxy substance similar to fat that the human body needs to help build cells. The liver makes all the cholesterol that a person's body needs. Having high cholesterol (hypercholesterolemia) increases a person's risk for heart disease and stroke. Extra (excess) cholesterol comes from the food the person eats. High cholesterol can often be prevented with diet and lifestyle changes. If you already have high cholesterol, you can control it with diet and lifestyle changes and with medicine. How can high cholesterol affect me? If you have high cholesterol, deposits (plaques) may build up on the walls of your arteries. The arteries are the blood vessels that carry blood away from your heart. Plaques make the arteries narrower and stiffer. This can limit or block blood flow and cause blood clots to form. Blood clots:  Are tiny balls of cells that form in your blood.  Can move to the heart or brain, causing a heart attack or stroke. Plaques in arteries greatly increase your risk for heart attack and stroke.Making diet and lifestyle changes can reduce your risk for these conditions that may threaten your life. What can increase my risk? This condition is more likely to develop in people who:  Eat foods that are high in saturated fat or cholesterol. Saturated fat is mostly found in: ? Foods that contain animal fat, such as red meat and  some dairy products. ? Certain fatty foods made from plants, such as tropical oils.  Are overweight.  Are not getting enough exercise.  Have a family history of high cholesterol. What actions can I take to prevent this? Nutrition   Eat less saturated fat.  Avoid trans fats (partially hydrogenated oils). These are often found in margarine and in some baked goods, fried  foods, and snacks bought in packages.  Avoid precooked or cured meat, such as sausages or meat loaves.  Avoid foods and drinks that have added sugars.  Eat more fruits, vegetables, and whole grains.  Choose healthy sources of protein, such as fish, poultry, lean cuts of red meat, beans, peas, lentils, and nuts.  Choose healthy sources of fat, such as: ? Nuts. ? Vegetable oils, especially olive oil. ? Fish that have healthy fats (omega-3 fatty acids), such as mackerel or salmon. The items listed above may not be a complete list of recommended foods and beverages. Contact a dietitian for more information. Lifestyle  Lose weight if you are overweight. Losing 5-10 lb (2.3-4.5 kg) can help prevent or control high cholesterol. It can also lower your risk for diabetes and high blood pressure. Ask your health care provider to help you with a diet and exercise plan to lose weight safely.  Do not use any products that contain nicotine or tobacco, such as cigarettes, e-cigarettes, and chewing tobacco. If you need help quitting, ask your health care provider.  Limit your alcohol intake. ? Do not drink alcohol if:  Your health care provider tells you not to drink.  You are pregnant, may be pregnant, or are planning to become pregnant. ? If you drink alcohol:  Limit how much you use to:  0-1 drink a day for women.  0-2 drinks a day for men.  Be aware of how much alcohol is in your drink. In the [place] one drink equals one 12 oz bottle of beer (355 mL), one 5 oz glass of wine (148 mL), or one 1 oz glass of hard liquor (44 mL). Activity   Get enough exercise. Each week, do at least 150 minutes of exercise that takes a medium level of effort (moderate-intensity exercise). ? This is exercise that:  Makes your heart beat faster and makes you breathe harder than usual.  Allows you to still be able to talk. ? You could exercise in short sessions several times a day or longer sessions a few  times a week. For example, on 5 days each week, you could walk fast or ride your bike 3 times a day for 10 minutes each time.  Do exercises as told by your health care provider. Medicines  In addition to diet and lifestyle changes, your health care provider may recommend medicines to help lower cholesterol. This may be a medicine to lower the amount of cholesterol your liver makes. You may need medicine if: ? Diet and lifestyle changes do not lower your cholesterol enough. ? You have high cholesterol and other risk factors for heart disease or stroke.  Take over-the-counter and prescription medicines only as told by your health care provider. General information  Manage your risk factors for high cholesterol. Talk with your health care provider about all your risk factors and how to lower your risk.  Manage other conditions that you have, such as diabetes or high blood pressure (hypertension).  Have blood tests to check your cholesterol levels at regular points in time as told by your health care provider.  Keep all follow-up visits as told by your health care provider. This is important. Where to find more information  American Heart Association: www.heart.org  National Heart, Lung, and Blood Institute: https://wilson-eaton.com/ Summary  High cholesterol increases your risk for heart disease and stroke. By keeping your cholesterol level low, you can reduce your risk for these conditions.  High cholesterol can often be prevented with diet and lifestyle changes.  Work with your health care provider to manage your risk factors, and have your blood tested regularly. This information is not intended to replace advice given to you by your health care provider. Make sure you discuss any questions you have with your health care provider. Document Released: 07/27/2015 Document Revised: 11/03/2018 Document Reviewed: 03/20/2016 Elsevier Patient Education  2020 [employer].

## 2019-04-10 NOTE — Progress Notes (Signed)
Established Patient Office Visit  Subjective:  Patient ID: Kevin Black, male    DOB: 25-Apr-1981  Age: 38 y.o. MRN: [id number]  CC:  Chief Complaint  Patient presents with  . Blood In Stools    HPI Kevin Black presents for follow-up of his elevated LDL cholesterol and elevated liver function test.  Patient has been more mindful of the cholesterol in his diet and is cut back on his drinking.  He has no more than 1 alcoholic drink daily.  He is seen some blood after stooling with wiping.  This happens occasionally particularly when he has to push hard to have a bowel movement.  He does enjoy sitting on the toilet when he stools for prolonged periods.  He has not felt any masses lumps or bumps.  No family history of colon disease.  No painful bowel movements.  Is planning on seeing the dentist this fall.  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  History reviewed. No pertinent past medical history.  Past Surgical History:  Procedure Laterality Date  . FINGER SURGERY    . SHOULDER SURGERY Right     History reviewed. No pertinent family history.  Social History   Socioeconomic History  . Marital status: Married    Spouse name: Not on file  . Number of children: Not on file  . Years of education: Not on file  . Highest education level: Not on file  Occupational History  . Not on file  Social Needs  . Financial resource strain: Not on file  . Food insecurity    Worry: Not on file    Inability: Not on file  . Transportation needs    Medical: Not on file    Non-medical: Not on file  Tobacco Use  . Smoking status: Never Smoker  . Smokeless tobacco: Never Used  Substance and Sexual Activity  . Alcohol use: Yes    Comment: one drink daily.  . Drug use: Never  . Sexual activity: Not on file  Lifestyle  . Physical activity    Days per week: Not on file    Minutes per session: Not on file  . Stress: Not on file  Relationships  . Social Herbalist on phone: Not  on file    Gets together: Not on file    Attends religious service: Not on file    Active member of club or organization: Not on file    Attends meetings of clubs or organizations: Not on file    Relationship status: Not on file  . Intimate partner violence    Fear of current or ex partner: Not on file    Emotionally abused: Not on file    Physically abused: Not on file    Forced sexual activity: Not on file  Other Topics Concern  . Not on file  Social History Narrative  . Not on file    Outpatient Medications Prior to Visit  Medication Sig Dispense Refill  . selenium sulfide (SELSUN) 2.5 % shampoo Apply daily to rash and leave on for 1 hour and then wash off for one week. 118 mL 5   No facility-administered medications prior to visit.     No Known Allergies  ROS Review of Systems  Constitutional: Negative.  Negative for unexpected weight change.  HENT: Negative.   Eyes: Negative for photophobia and visual disturbance.  Respiratory: Negative.   Cardiovascular: Negative.   Gastrointestinal: Positive for constipation. Negative for  abdominal distention, abdominal pain, anal bleeding and blood in stool.  Endocrine: Negative for polyphagia and polyuria.  Genitourinary: Negative.  Negative for hematuria.  Musculoskeletal: Negative for gait problem and joint swelling.  Skin: Negative for pallor and rash.  Allergic/Immunologic: Negative for immunocompromised state.  Neurological: Negative for light-headedness and headaches.  Hematological: Does not bruise/bleed easily.  Psychiatric/Behavioral: Negative.       Objective:    Physical Exam  Constitutional: He is oriented to person, place, and time. He appears well-developed and well-nourished. No distress.  HENT:  Head: Normocephalic and atraumatic.  Right Ear: External ear normal.  Left Ear: External ear normal.  Mouth/Throat: Oropharynx is clear and moist. No oropharyngeal exudate.  Eyes: Pupils are equal, round, and  reactive to light. Conjunctivae are normal. Right eye exhibits no discharge. Left eye exhibits no discharge. No scleral icterus.  Neck: Neck supple. No JVD present. No tracheal deviation present. No thyromegaly present.  Cardiovascular: Normal rate, regular rhythm and normal heart sounds.  Pulmonary/Chest: Effort normal and breath sounds normal. No stridor.  Abdominal: Bowel sounds are normal. He exhibits no distension. There is no abdominal tenderness. There is no rebound and no guarding.  Genitourinary: Rectum:     Guaiac result negative.     No rectal mass, anal fissure, tenderness, external hemorrhoid, internal hemorrhoid or abnormal anal tone.   Musculoskeletal:        General: No edema.  Lymphadenopathy:    He has no cervical adenopathy.  Neurological: He is alert and oriented to person, place, and time.  Skin: Skin is warm and dry. He is not diaphoretic.  Psychiatric: He has a normal mood and affect. His behavior is normal.    BP 124/70   Pulse 84   Ht 6\' 2"  (1.88 m)   Wt 225 lb 6 oz (102.2 kg)   SpO2 98%   BMI 28.94 kg/m  Wt Readings from Last 3 Encounters:  04/10/19 225 lb 6 oz (102.2 kg)  08/21/18 218 lb (98.9 kg)  04/25/18 221 lb 2 oz (100.3 kg)   BP Readings from Last 3 Encounters:  04/10/19 124/70  08/21/18 120/70  04/25/18 124/80   Guideline developer:  UpToDate (see UpToDate for funding source) Date Released: June 2014  Health Maintenance Due  Topic Date Due  . TETANUS/TDAP  07/31/1998  . INFLUENZA VACCINE  02/24/2019    There are no preventive care reminders to display for this patient.  No results found for: TSH Lab Results  Component Value Date   WBC 7.2 08/21/2018   HGB 15.6 08/21/2018   HCT 45.4 08/21/2018   MCV 88.8 08/21/2018   PLT 307.0 08/21/2018   Lab Results  Component Value Date   NA 137 04/27/2018   K 3.9 04/27/2018   CO2 31 04/27/2018   GLUCOSE 98 04/27/2018   BUN 13 04/27/2018   CREATININE 1.02 04/27/2018   BILITOT 0.6  08/21/2018   ALKPHOS 75 08/21/2018   AST 36 08/21/2018   ALT 68 (H) 08/21/2018   PROT 7.4 08/21/2018   ALBUMIN 4.5 08/21/2018   CALCIUM 9.7 04/27/2018   ANIONGAP 4 (L) 08/08/2017   GFR 86.54 04/27/2018   Lab Results  Component Value Date   CHOL 234 (H) 08/21/2018   Lab Results  Component Value Date   HDL 56.30 08/21/2018   Lab Results  Component Value Date   LDLCALC 165 (H) 08/21/2018   Lab Results  Component Value Date   TRIG 63.0 08/21/2018   Lab Results  Component Value Date   CHOLHDL 4 08/21/2018   No results found for: HGBA1C    Assessment & Plan:   Problem List Items Addressed This Visit      Digestive   Slow transit constipation   Relevant Medications   Psyllium 25 % POWD     Other   Hematuria - Primary   Relevant Orders   Urinalysis, Routine w reflex microscopic   Elevated LDL cholesterol level   Relevant Orders   LDL cholesterol, direct   Lipid panel   Elevated liver enzymes   Relevant Orders   Gamma GT   Comprehensive metabolic panel   Urinalysis, Routine w reflex microscopic      Meds ordered this encounter  Medications  . Psyllium 25 % POWD    Sig: Take 30 mLs by mouth daily as needed.    Dispense:  368 g    Refill:  2    Follow-up: Return in about 6 months (around 10/08/2019), or if symptoms worsen or fail to improve.   Patient was given information on constipation and advised to avoid prolonged sitting on the toilet.  He was advised to use Metamucil as needed for constipation.  He was also given information on preventing high cholesterol and how to adjust his diet to lower the fat and cholesterol.

## 2019-06-01 NOTE — Progress Notes (Signed)
This encounter was created in error - please disregard.

## 2019-10-16 ENCOUNTER — Other Ambulatory Visit: Payer: Self-pay

## 2019-10-17 ENCOUNTER — Ambulatory Visit (INDEPENDENT_AMBULATORY_CARE_PROVIDER_SITE_OTHER): Payer: No Typology Code available for payment source | Admitting: Family Medicine

## 2019-10-17 ENCOUNTER — Encounter: Payer: Self-pay | Admitting: Family Medicine

## 2019-10-17 VITALS — BP 130/86 | HR 79 | Temp 97.4°F | Ht 74.0 in | Wt 230.4 lb

## 2019-10-17 DIAGNOSIS — E7800 Pure hypercholesterolemia, unspecified: Secondary | ICD-10-CM | POA: Diagnosis not present

## 2019-10-17 DIAGNOSIS — K649 Unspecified hemorrhoids: Secondary | ICD-10-CM | POA: Insufficient documentation

## 2019-10-17 HISTORY — DX: Unspecified hemorrhoids: K64.9

## 2019-10-17 MED ORDER — WITCH HAZEL-GLYCERIN EX PADS: MEDICATED_PAD | CUTANEOUS | 12 refills | Status: DC

## 2019-10-17 MED ORDER — HYDROCORTISONE ACE-PRAMOXINE 1-1 % EX CREA: 1.0000 "application " | TOPICAL_CREAM | Freq: Two times a day (BID) | CUTANEOUS | 0 refills | Status: DC

## 2019-10-17 NOTE — Patient Instructions (Signed)
Constipation, Adult Constipation is when a person has fewer bowel movements in a week than normal, has difficulty having a bowel movement, or has stools that are dry, hard, or larger than normal. Constipation may be caused by an underlying condition. It may become worse with age if a person takes certain medicines and does not take in enough fluids. Follow these instructions at home: Eating and drinking   Eat foods that have a lot of fiber, such as fresh fruits and vegetables, whole grains, and beans.  Limit foods that are high in fat, low in fiber, or overly processed, such as french fries, hamburgers, cookies, candies, and soda.  Drink enough fluid to keep your urine clear or pale yellow. General instructions  Exercise regularly or as told by your health care provider.  Go to the restroom when you have the urge to go. Do not hold it in.  Take over-the-counter and prescription medicines only as told by your health care provider. These include any fiber supplements.  Practice pelvic floor retraining exercises, such as deep breathing while relaxing the lower abdomen and pelvic floor relaxation during bowel movements.  Watch your condition for any changes.  Keep all follow-up visits as told by your health care provider. This is important. Contact a health care provider if:  You have pain that gets worse.  You have a fever.  You do not have a bowel movement after 4 days.  You vomit.  You are not hungry.  You lose weight.  You are bleeding from the anus.  You have thin, pencil-like stools. Get help right away if:  You have a fever and your symptoms suddenly get worse.  You leak stool or have blood in your stool.  Your abdomen is bloated.  You have severe pain in your abdomen.  You feel dizzy or you faint. This information is not intended to replace advice given to you by your health care provider. Make sure you discuss any questions you have with your health care  provider. Document Revised: 06/24/2017 Document Reviewed: 12/31/2015 Elsevier Patient Education  2020 [employer].  Hemorrhoids Hemorrhoids are swollen veins in and around the rectum or anus. There are two types of hemorrhoids:  Internal hemorrhoids. These occur in the veins that are just inside the rectum. They may poke through to the outside and become irritated and painful.  External hemorrhoids. These occur in the veins that are outside the anus and can be felt as a painful swelling or hard lump near the anus. Most hemorrhoids do not cause serious problems, and they can be managed with home treatments such as diet and lifestyle changes. If home treatments do not help the symptoms, procedures can be done to shrink or remove the hemorrhoids. What are the causes? This condition is caused by increased pressure in the anal area. This pressure may result from various things, including:  Constipation.  Straining to have a bowel movement.  Diarrhea.  Pregnancy.  Obesity.  Sitting for long periods of time.  Heavy lifting or other activity that causes you to strain.  Anal sex.  Riding a bike for a long period of time. What are the signs or symptoms? Symptoms of this condition include:  Pain.  Anal itching or irritation.  Rectal bleeding.  Leakage of stool (feces).  Anal swelling.  One or more lumps around the anus. How is this diagnosed? This condition can often be diagnosed through a visual exam. Other exams or tests may also be done, such as:  An exam that involves feeling the rectal area with a gloved hand (digital rectal exam).  An exam of the anal canal that is done using a small tube (anoscope).  A blood test, if you have lost a significant amount of blood.  A test to look inside the colon using a flexible tube with a camera on the end (sigmoidoscopy or colonoscopy). How is this treated? This condition can usually be treated at home. However, various  procedures may be done if dietary changes, lifestyle changes, and other home treatments do not help your symptoms. These procedures can help make the hemorrhoids smaller or remove them completely. Some of these procedures involve surgery, and others do not. Common procedures include:  Rubber band ligation. Rubber bands are placed at the base of the hemorrhoids to cut off their blood supply.  Sclerotherapy. Medicine is injected into the hemorrhoids to shrink them.  Infrared coagulation. A type of light energy is used to get rid of the hemorrhoids.  Hemorrhoidectomy surgery. The hemorrhoids are surgically removed, and the veins that supply them are tied off.  Stapled hemorrhoidopexy surgery. The surgeon staples the base of the hemorrhoid to the rectal wall. Follow these instructions at home: Eating and drinking   Eat foods that have a lot of fiber in them, such as whole grains, beans, nuts, fruits, and vegetables.  Ask your health care provider about taking products that have added fiber (fiber supplements).  Reduce the amount of fat in your diet. You can do this by eating low-fat dairy products, eating less red meat, and avoiding processed foods.  Drink enough fluid to keep your urine pale yellow. Managing pain and swelling   Take warm sitz baths for 20 minutes, 3-4 times a day to ease pain and discomfort. You may do this in a bathtub or using a portable sitz bath that fits over the toilet.  If directed, apply ice to the affected area. Using ice packs between sitz baths may be helpful. ? Put ice in a plastic bag. ? Place a towel between your skin and the bag. ? Leave the ice on for 20 minutes, 2-3 times a day. General instructions  Take over-the-counter and prescription medicines only as told by your health care provider.  Use medicated creams or suppositories as told.  Get regular exercise. Ask your health care provider how much and what kind of exercise is best for you. In  general, you should do moderate exercise for at least 30 minutes on most days of the week (150 minutes each week). This can include activities such as walking, biking, or yoga.  Go to the bathroom when you have the urge to have a bowel movement. Do not wait.  Avoid straining to have bowel movements.  Keep the anal area dry and clean. Use wet toilet paper or moist towelettes after a bowel movement.  Do not sit on the toilet for long periods of time. This increases blood pooling and pain.  Keep all follow-up visits as told by your health care provider. This is important. Contact a health care provider if you have:  Increasing pain and swelling that are not controlled by treatment or medicine.  Difficulty having a bowel movement, or you are unable to have a bowel movement.  Pain or inflammation outside the area of the hemorrhoids. Get help right away if you have:  Uncontrolled bleeding from your rectum. Summary  Hemorrhoids are swollen veins in and around the rectum or anus.  Most hemorrhoids can be  managed with home treatments such as diet and lifestyle changes.  Taking warm sitz baths can help ease pain and discomfort.  In severe cases, procedures or surgery can be done to shrink or remove the hemorrhoids. This information is not intended to replace advice given to you by your health care provider. Make sure you discuss any questions you have with your health care provider. Document Revised: 12/08/2018 Document Reviewed: 12/01/2017 Elsevier Patient Education  [place].

## 2019-10-17 NOTE — Progress Notes (Signed)
Established Patient Office Visit  Subjective:  Patient ID: Kevin Black, male    DOB: 08-Apr-1981  Age: 39 y.o. MRN: [id number]  CC:  Chief Complaint  Patient presents with  . Hemorrhoids    c/o hemorrhoids x 2 months becoming worse and painful    HPI Kevin Black presents for evaluation and treatment of a tender mass on his anus.  There is blood with wiping.  Occasional painful bowel movements.  He has worked hard to avoid constipation and prolonged toilet  sitting.  He no longer feels as though he is constipated.  He has been exercising and adding more fruits and vegetables into his diet in an effort to lower his cholesterol.  There is been no weight loss or night sweats.  Denies abdominal pain.  History reviewed. No pertinent past medical history.  Past Surgical History:  Procedure Laterality Date  . FINGER SURGERY    . SHOULDER SURGERY Right     History reviewed. No pertinent family history.  Social History   Socioeconomic History  . Marital status: Married    Spouse name: Not on file  . Number of children: Not on file  . Years of education: Not on file  . Highest education level: Not on file  Occupational History  . Not on file  Tobacco Use  . Smoking status: Never Smoker  . Smokeless tobacco: Never Used  Substance and Sexual Activity  . Alcohol use: Yes    Comment: one drink daily.  . Drug use: Never  . Sexual activity: Not on file  Other Topics Concern  . Not on file  Social History Narrative  . Not on file   Social Determinants of Health   Financial Resource Strain:   . Difficulty of Paying Living Expenses:   Food Insecurity:   . Worried About Charity fundraiser in the Last Year:   . Arboriculturist in the Last Year:   Transportation Needs:   . Film/video editor (Medical):   Marland Kitchen Lack of Transportation (Non-Medical):   Physical Activity:   . Days of Exercise per Week:   . Minutes of Exercise per Session:   Stress:   . Feeling of Stress :     Social Connections:   . Frequency of Communication with Friends and Family:   . Frequency of Social Gatherings with Friends and Family:   . Attends Religious Services:   . Active Member of Clubs or Organizations:   . Attends Archivist Meetings:   Marland Kitchen Marital Status:   Intimate Partner Violence:   . Fear of Current or Ex-Partner:   . Emotionally Abused:   Marland Kitchen Physically Abused:   . Sexually Abused:     Outpatient Medications Prior to Visit  Medication Sig Dispense Refill  . Psyllium 25 % POWD Take 30 mLs by mouth daily as needed. (Patient not taking: Reported on 10/17/2019) 368 g 2  . selenium sulfide (SELSUN) 2.5 % shampoo Apply daily to rash and leave on for 1 hour and then wash off for one week. (Patient not taking: Reported on 10/17/2019) 118 mL 5   No facility-administered medications prior to visit.    No Known Allergies  ROS Review of Systems  Constitutional: Negative.   HENT: Negative.   Eyes: Negative for photophobia and visual disturbance.  Respiratory: Negative.   Cardiovascular: Negative.   Gastrointestinal: Positive for anal bleeding and rectal pain. Negative for abdominal distention, abdominal pain, blood in stool, constipation, diarrhea,  nausea and vomiting.  Endocrine: Negative for polyphagia and polyuria.  Neurological: Negative.   Hematological: Does not bruise/bleed easily.  Psychiatric/Behavioral: Negative.       Objective:    Physical Exam  Constitutional: He is oriented to person, place, and time. He appears well-developed and well-nourished. No distress.  HENT:  Head: Normocephalic and atraumatic.  Right Ear: External ear normal.  Left Ear: External ear normal.  Eyes: Conjunctivae are normal. Right eye exhibits no discharge. Left eye exhibits no discharge. No scleral icterus.  Neck: No tracheal deviation present.  Pulmonary/Chest: Effort normal. No stridor.  Genitourinary: Rectum:     Guaiac result negative.     External hemorrhoid  present.     No rectal mass, anal fissure, tenderness, internal hemorrhoid or abnormal anal tone.      Neurological: He is alert and oriented to person, place, and time.  Skin: Skin is warm and dry. He is not diaphoretic.  Psychiatric: He has a normal mood and affect. His behavior is normal.    BP 130/86   Pulse 79   Temp (!) 97.4 F (36.3 C) (Tympanic)   Ht 6\' 2"  (1.88 m)   Wt 230 lb 6.4 oz (104.5 kg)   SpO2 98%   BMI 29.58 kg/m  Wt Readings from Last 3 Encounters:  10/17/19 230 lb 6.4 oz (104.5 kg)  04/10/19 225 lb 6 oz (102.2 kg)  08/21/18 218 lb (98.9 kg)     There are no preventive care reminders to display for this patient.  There are no preventive care reminders to display for this patient.  No results found for: TSH Lab Results  Component Value Date   WBC 7.2 08/21/2018   HGB 15.6 08/21/2018   HCT 45.4 08/21/2018   MCV 88.8 08/21/2018   PLT 307.0 08/21/2018   Lab Results  Component Value Date   NA 138 04/10/2019   K 4.3 04/10/2019   CO2 30 04/10/2019   GLUCOSE 96 04/10/2019   BUN 16 04/10/2019   CREATININE 0.92 04/10/2019   BILITOT 0.4 04/10/2019   ALKPHOS 73 04/10/2019   AST 26 04/10/2019   ALT 42 04/10/2019   PROT 7.3 04/10/2019   ALBUMIN 4.4 04/10/2019   CALCIUM 9.8 04/10/2019   ANIONGAP 4 (L) 08/08/2017   GFR 91.26 04/10/2019   Lab Results  Component Value Date   CHOL 247 (H) 04/10/2019   Lab Results  Component Value Date   HDL 57.30 04/10/2019   Lab Results  Component Value Date   LDLCALC 160 (H) 04/10/2019   Lab Results  Component Value Date   TRIG 147.0 04/10/2019   Lab Results  Component Value Date   CHOLHDL 4 04/10/2019   No results found for: HGBA1C    Assessment & Plan:   Problem List Items Addressed This Visit      Cardiovascular and Mediastinum   Hemorrhoids - Primary   Relevant Medications   pramoxine-hydrocortisone (PROCTOCREAM-HC) 1-1 % rectal cream   witch hazel-glycerin (TUCKS) pad   Other Relevant  Orders   Ambulatory referral to General Surgery     Other   Elevated LDL cholesterol level      Meds ordered this encounter  Medications  . pramoxine-hydrocortisone (PROCTOCREAM-HC) 1-1 % rectal cream    Sig: Place 1 application rectally 2 (two) times daily.    Dispense:  30 g    Refill:  0  . witch hazel-glycerin (TUCKS) pad    Sig: Wipe prior to applying proctocream and after stooling.  Dispense:  100 each    Refill:  12    Follow-up: Return if symptoms worsen or fail to improve.  Patient was given information on hemorrhoids and constipation.  He is change his diet to help lower his cholesterol.  Patient will use the ProctoCream twice daily.  He will wipe with Tucks pads prior to applying the cream and after stooling.  Libby Maw, MD

## 2019-10-18 ENCOUNTER — Ambulatory Visit: Payer: No Typology Code available for payment source | Admitting: Family Medicine

## 2020-05-15 ENCOUNTER — Other Ambulatory Visit: Payer: Self-pay

## 2020-05-16 ENCOUNTER — Ambulatory Visit (INDEPENDENT_AMBULATORY_CARE_PROVIDER_SITE_OTHER): Payer: No Typology Code available for payment source | Admitting: Nurse Practitioner

## 2020-05-16 ENCOUNTER — Encounter: Payer: Self-pay | Admitting: Nurse Practitioner

## 2020-05-16 VITALS — BP 122/82 | HR 84 | Temp 97.3°F | Ht 74.0 in | Wt 227.4 lb

## 2020-05-16 DIAGNOSIS — G8929 Other chronic pain: Secondary | ICD-10-CM

## 2020-05-16 DIAGNOSIS — M79672 Pain in left foot: Secondary | ICD-10-CM | POA: Diagnosis not present

## 2020-05-16 MED ORDER — IBUPROFEN 600 MG PO TABS: 600.0000 mg | ORAL_TABLET | Freq: Three times a day (TID) | ORAL | 0 refills | Status: DC

## 2020-05-16 NOTE — Progress Notes (Signed)
Subjective:  Patient ID: Kevin Black, male    DOB: [birth year]  Age: 39 y.o. MRN: [id number]  CC: Acute Visit (Pt c/o left heel pain x2 months. Pt denies any known injury. Pt declines flu vaccine. )  HPI Kevin Black presents with left heel pain x several years, worse in last 89months. Denies any injury. Current job entails prolong standing and walking. Pain is worse in AM and constant all day while walking. Has not tried any OTC medication or home remedy.  Reviewed past Medical, Social and Family history today.  Outpatient Medications Prior to Visit  Medication Sig Dispense Refill   pramoxine-hydrocortisone (PROCTOCREAM-HC) 1-1 % rectal cream Place 1 application rectally 2 (two) times daily. (Patient not taking: Reported on 05/16/2020) 30 g 0   Psyllium 25 % POWD Take 30 mLs by mouth daily as needed. (Patient not taking: Reported on 10/17/2019) 368 g 2   selenium sulfide (SELSUN) 2.5 % shampoo Apply daily to rash and leave on for 1 hour and then wash off for one week. (Patient not taking: Reported on 10/17/2019) 118 mL 5   witch hazel-glycerin (TUCKS) pad Wipe prior to applying proctocream and after stooling. (Patient not taking: Reported on 05/16/2020) 100 each 12   No facility-administered medications prior to visit.    ROS See HPI  Objective:  BP 122/82 (BP Location: Left Arm, Patient Position: Sitting, Cuff Size: Large)    Pulse 84    Temp (!) 97.3 F (36.3 C) (Temporal)    Ht 6\' 2"  (1.88 m)    Wt 227 lb 6.4 oz (103.1 kg)    SpO2 99%    BMI 29.20 kg/m   Physical Exam Vitals reviewed.  Cardiovascular:     Pulses:          Dorsalis pedis pulses are 2+ on the right side and 2+ on the left side.       Posterior tibial pulses are 2+ on the right side and 2+ on the left side.  Musculoskeletal:        General: Tenderness present. No swelling or deformity.     Right lower leg: No edema.     Left lower leg: No edema.     Right ankle: Normal.     Left ankle: Normal.     Right foot:  Normal range of motion. No prominent metatarsal heads.     Left foot: Normal range of motion. No prominent metatarsal heads.       Feet:  Feet:     Right foot:     Skin integrity: Skin integrity normal.     Toenail Condition: Right toenails are abnormally thick.     Left foot:     Skin integrity: Skin integrity normal.     Toenail Condition: Left toenails are abnormally thick.  Skin:    Findings: No erythema.  Neurological:     Mental Status: He is alert and oriented to person, place, and time.     Assessment & Plan:  This visit occurred during the SARS-CoV-2 public health emergency.  Safety protocols were in place, including screening questions prior to the visit, additional usage of staff PPE, and extensive cleaning of exam room while observing appropriate contact time as indicated for disinfecting solutions.   Kevin Black was seen today for acute visit.  Diagnoses and all orders for this visit:  Chronic heel pain, left -     ibuprofen (ADVIL) 600 MG tablet; Take 1 tablet (600 mg total) by mouth 3 (three)  times daily. -     Ambulatory referral to Podiatry   Problem List Items Addressed This Visit    None    Visit Diagnoses    Chronic heel pain, left    -  Primary   Relevant Medications   ibuprofen (ADVIL) 600 MG tablet   Other Relevant Orders   Ambulatory referral to Podiatry      Follow-up: Return if symptoms worsen or fail to improve.  Wilfred Lacy, NP

## 2020-05-16 NOTE — Patient Instructions (Signed)
Use Dr. Darrick Grinder silicone heel insert daily in all your shoe. Avoid wearing slippers or going without shoes.  Massage heel and arch in morning and evening with iced water bottle x10-62mins each.  You will be contacted to schedule appt with podiatry.   Heel Spur  A heel spur is a bony growth that forms on the bottom of the heel bone (calcaneus). Heel spurs are common. They often cause inflammation in the band of tissue that connects the toes to the heel bone (plantar fascia). This may cause pain on the bottom of the foot, near the heel. Many people with plantar fasciitis also have heel spurs. However, spurs are not the cause of plantar fasciitis pain. What are the causes? The exact cause of heel spurs is not known. They may be caused by:  Pressure on the heel bone.  Bands of tissue (tendons) pulling on the heel bone. What increases the risk? You are more likely to develop this condition if you:  Are older than 40.  Are overweight.  Have wear-and-tear arthritis (osteoarthritis).  Have plantar fascia inflammation.  Participate in sports or activities that include a lot of running or jumping.  Wear poorly fitted shoes. What are the signs or symptoms? Some people have no symptoms. If you do have symptoms, they may include:  Pain in the bottom of your heel.  Pain that is worse when you first get out of bed.  Pain that gets worse after walking or standing. How is this diagnosed? This condition may be diagnosed based on:  Your symptoms and medical history.  A physical exam.  A foot X-ray. How is this treated? Treatment for this condition depends on how much pain you have. Treatment options may include:  Doing stretching exercises.  Losing weight, if necessary.  Wearing specific shoes or inserts inside of shoes (orthotics) for comfort and support.  Wearing splints on your feet while you sleep. Splints keep your feet in a position (usually 90 degrees) that should prevent  and relieve the pain you feel when you first get out of bed. They also make stretching easier in the morning.  Taking over-the-counter medicine to relieve pain, such as NSAIDs.  Using high-intensity sound waves to break up the heel spur (extracorporeal shock wave therapy).  Getting steroid injections in your heel to reduce inflammation.  Having surgery, if your heel spur causes long-term (chronic) pain. Follow these instructions at home:  Activity  Avoid activities that cause pain until you recover, or for as long as directed by your health care provider.  Do stretching exercises as directed. Stretch before exercising or being physically active. Managing pain, stiffness, and swelling  If directed, put ice on your foot: ? Put ice in a plastic bag. ? Place a towel between your skin and the bag. ? Leave the ice on for 20 minutes, 2-3 times a day.  Move your toes often to avoid stiffness and to lessen swelling.  When possible, raise (elevate) your foot above the level of your heart while you are sitting or lying down. General instructions  Take over-the-counter and prescription medicines only as told by your health care provider.  Wear supportive shoes that fit well. Wear splints, inserts, or orthotics as told by your health care provider.  If recommended, work with your health care provider to lose weight. This can relieve pressure on your foot.  Do not use any products that contain nicotine or tobacco, such as cigarettes and e-cigarettes. These can affect bone growth and  healing. If you need help quitting, ask your health care provider.  Keep all follow-up visits as told by your health care provider. This is important. Contact a health care provider if:  Your pain does not go away with treatment.  Your pain gets worse. Summary  A heel spur is a bony growth that forms on the bottom of the heel bone (calcaneus).  Heel spurs often cause inflammation in the band of tissue that  connects the toes to the heel bone (plantar fascia). This may cause pain on the bottom of the foot, near the heel.  Doing stretching exercises, losing weight, wearing specific shoes or shoe inserts, wearing splints while you sleep, and taking pain medicine may ease the pain and stiffness.  Other treatment options may include high-intensity sound waves to break up the heel spur, steroid injections, or surgery. This information is not intended to replace advice given to you by your health care provider. Make sure you discuss any questions you have with your health care provider. Document Revised: 06/29/2017 Document Reviewed: 06/29/2017 Elsevier Patient Education  2020 [employer].

## 2020-05-30 ENCOUNTER — Ambulatory Visit (INDEPENDENT_AMBULATORY_CARE_PROVIDER_SITE_OTHER): Payer: No Typology Code available for payment source | Admitting: Podiatry

## 2020-05-30 ENCOUNTER — Ambulatory Visit (INDEPENDENT_AMBULATORY_CARE_PROVIDER_SITE_OTHER): Payer: No Typology Code available for payment source

## 2020-05-30 ENCOUNTER — Other Ambulatory Visit: Payer: Self-pay

## 2020-05-30 DIAGNOSIS — M722 Plantar fascial fibromatosis: Secondary | ICD-10-CM | POA: Diagnosis not present

## 2020-05-30 DIAGNOSIS — S9032XA Contusion of left foot, initial encounter: Secondary | ICD-10-CM

## 2020-05-30 MED ORDER — DICLOFENAC SODIUM 75 MG PO TBEC: 75.0000 mg | DELAYED_RELEASE_TABLET | Freq: Two times a day (BID) | ORAL | 2 refills | Status: DC

## 2020-05-30 NOTE — Patient Instructions (Signed)

## 2020-05-31 NOTE — Progress Notes (Signed)
Subjective:   Patient ID: Kevin Black, male   DOB: 39 y.o.   MRN: [id number]   HPI Patient states he has had worsening of heel pain for the last 3 months and has a several year history of some discomfort.  States it is worse when he gets up in the morning after periods of sitting and does not remember injury and does not smoke likes to be active   Review of Systems  All other systems reviewed and are negative.       Objective:  Physical Exam Vitals and nursing note reviewed.  Constitutional:      Appearance: He is well-developed.  Pulmonary:     Effort: Pulmonary effort is normal.  Musculoskeletal:        General: Normal range of motion.  Skin:    General: Skin is warm.  Neurological:     Mental Status: He is alert.     Neurovascular status intact muscle strength was found to be adequate range of motion within normal limits.  Patient is found to have exquisite discomfort plantar heel left at the insertional point of the tendon into the calcaneus with moderate flattening of the arch and is found to have good digital perfusion and is well oriented x3     Assessment:  Acute plantar fasciitis left with inflammation fluid around the medial band     Plan:  H&P reviewed condition and went ahead today did sterile prep and injected the fascia 3 mg Kenalog 5 mg Xylocaine applied fascial brace to lift up the arch gave instructions on physical therapy shoe gear modifications and placed on oral diclofenac 75 mg twice daily.  Reappoint for [place] to recheck  X-rays indicate small spur plantar heel no indications of stress fracture arthritis

## 2020-06-13 ENCOUNTER — Ambulatory Visit: Payer: No Typology Code available for payment source | Admitting: Podiatry

## 2020-09-06 ENCOUNTER — Other Ambulatory Visit: Payer: Self-pay | Admitting: Podiatry

## 2020-09-08 NOTE — Telephone Encounter (Signed)
Please advise 

## 2020-11-06 ENCOUNTER — Other Ambulatory Visit: Payer: Self-pay

## 2020-11-06 ENCOUNTER — Encounter: Payer: Self-pay | Admitting: Podiatry

## 2020-11-06 ENCOUNTER — Ambulatory Visit (INDEPENDENT_AMBULATORY_CARE_PROVIDER_SITE_OTHER): Payer: No Typology Code available for payment source | Admitting: Podiatry

## 2020-11-06 DIAGNOSIS — M722 Plantar fascial fibromatosis: Secondary | ICD-10-CM | POA: Diagnosis not present

## 2020-11-06 MED ORDER — TRIAMCINOLONE ACETONIDE 10 MG/ML IJ SUSP
10.0000 mg | Freq: Once | INTRAMUSCULAR | Status: AC
  Administered 2020-11-06: 10 mg

## 2020-11-09 NOTE — Progress Notes (Signed)
Subjective:   Patient ID: Kevin Black, male   DOB: 40 y.o.   MRN: [id number]   HPI Patient states he developed a lot of pain again in the bottom of his left heel orthotics in the past were effective but they have worn out and it is painful when pressed   ROS      Objective:  Physical Exam  Acute plantar fasciitis of the left heel with inflammation fluid medial band     Assessment:  Pain upon palpation to the plantar fascial left at insertion with moderate flatfoot deformity     Plan:  Acute plantar fasciitis which was discussed and at this point I did go ahead and casted for functional orthotics and I went ahead and I also did sterile prep and injected the fascia 3 mg Kenalog 5 mg Xylocaine  X-rays indicate small spur no indication stress fracture or advanced arthritis

## 2021-04-28 ENCOUNTER — Telehealth: Payer: Self-pay | Admitting: Family Medicine

## 2021-05-06 ENCOUNTER — Ambulatory Visit: Payer: No Typology Code available for payment source | Admitting: Family Medicine

## 2021-05-10 ENCOUNTER — Other Ambulatory Visit: Payer: Self-pay

## 2021-05-10 ENCOUNTER — Emergency Department (HOSPITAL_COMMUNITY)
Admission: EM | Admit: 2021-05-10 | Discharge: 2021-05-11 | Disposition: A | Discharge status: Home / Self Care | Payer: No Typology Code available for payment source | Attending: Emergency Medicine | Admitting: Emergency Medicine

## 2021-05-10 DIAGNOSIS — R110 Nausea: Secondary | ICD-10-CM | POA: Insufficient documentation

## 2021-05-10 DIAGNOSIS — R109 Unspecified abdominal pain: Secondary | ICD-10-CM | POA: Diagnosis present

## 2021-05-10 DIAGNOSIS — N200 Calculus of kidney: Secondary | ICD-10-CM | POA: Insufficient documentation

## 2021-05-10 DIAGNOSIS — Z79899 Other long term (current) drug therapy: Secondary | ICD-10-CM | POA: Insufficient documentation

## 2021-05-10 DIAGNOSIS — Z5321 Procedure and treatment not carried out due to patient leaving prior to being seen by health care provider: Secondary | ICD-10-CM | POA: Insufficient documentation

## 2021-05-10 LAB — CBC WITH DIFFERENTIAL/PLATELET
Abs Immature Granulocytes: 0.06 10*3/uL (ref 0.00–0.07)
Basophils Absolute: 0 10*3/uL (ref 0.0–0.1)
Basophils Relative: 0 %
Eosinophils Absolute: 0.2 10*3/uL (ref 0.0–0.5)
Eosinophils Relative: 1 %
HCT: 43.4 % (ref 39.0–52.0)
Hemoglobin: 14.7 g/dL (ref 13.0–17.0)
Immature Granulocytes: 0 %
Lymphocytes Relative: 8 %
Lymphs Abs: 1.4 10*3/uL (ref 0.7–4.0)
MCH: 29.7 pg (ref 26.0–34.0)
MCHC: 33.9 g/dL (ref 30.0–36.0)
MCV: 87.7 fL (ref 80.0–100.0)
Monocytes Absolute: 1.3 10*3/uL — ABNORMAL HIGH (ref 0.1–1.0)
Monocytes Relative: 8 %
Neutro Abs: 13.6 10*3/uL — ABNORMAL HIGH (ref 1.7–7.7)
Neutrophils Relative %: 83 %
Platelets: 294 10*3/uL (ref 150–400)
RBC: 4.95 MIL/uL (ref 4.22–5.81)
RDW: 12 % (ref 11.5–15.5)
WBC: 16.6 10*3/uL — ABNORMAL HIGH (ref 4.0–10.5)
nRBC: 0 % (ref 0.0–0.2)

## 2021-05-10 LAB — COMPREHENSIVE METABOLIC PANEL
ALT: 52 U/L — ABNORMAL HIGH (ref 0–44)
AST: 27 U/L (ref 15–41)
Albumin: 4.2 g/dL (ref 3.5–5.0)
Alkaline Phosphatase: 63 U/L (ref 38–126)
Anion gap: 9 (ref 5–15)
BUN: 10 mg/dL (ref 6–20)
CO2: 27 mmol/L (ref 22–32)
Calcium: 9.6 mg/dL (ref 8.9–10.3)
Chloride: 102 mmol/L (ref 98–111)
Creatinine, Ser: 1.1 mg/dL (ref 0.61–1.24)
GFR, Estimated: >60 mL/min (ref >60)
Glucose, Bld: 103 mg/dL — ABNORMAL HIGH (ref 70–99)
Potassium: 4 mmol/L (ref 3.5–5.1)
Sodium: 138 mmol/L (ref 135–145)
Total Bilirubin: 1 mg/dL (ref 0.3–1.2)
Total Protein: 7.5 g/dL (ref 6.5–8.1)

## 2021-05-10 LAB — URINALYSIS, ROUTINE W REFLEX MICROSCOPIC
Bacteria, UA: NONE SEEN
Bilirubin Urine: NEGATIVE
Glucose, UA: NEGATIVE mg/dL
Hgb urine dipstick: NEGATIVE
Ketones, ur: NEGATIVE mg/dL
Nitrite: NEGATIVE
Protein, ur: NEGATIVE mg/dL
Specific Gravity, Urine: 1.014 (ref 1.005–1.030)
pH: 5 (ref 5.0–8.0)

## 2021-05-10 LAB — LIPASE, BLOOD: Lipase: 24 U/L (ref 11–51)

## 2021-05-10 NOTE — ED Triage Notes (Signed)
Pt. Stated, Ive had rt. Side stomach pain for a week and gave me some pills but not helping.

## 2021-05-10 NOTE — ED Provider Notes (Signed)
Emergency Medicine Provider Triage Evaluation Note  Kevin Black , a 40 y.o. male  was evaluated in triage.  Pt complains of gradual onset, constant, sharp, right sided stomach pain x 1 week. Also complains of nausea. Pain worsened with eating. Went to UC last week with negative U/A. Treated for muscle spasm without relief. No fevers, chills, vomiting, diarrhea.  Review of Systems  Positive: + abd pain Negative: - urinary symptoms  Physical Exam  BP (!) 153/97 (BP Location: Right Arm)   Pulse 77   Temp 98.5 F (36.9 C) (Oral)   Resp 18   SpO2 100%  Gen:   Awake, no distress   Resp:  Normal effort  MSK:   Moves extremities without difficulty  Other:  + right mid abd pain  Medical Decision Making  Medically screening exam initiated at 4:18 PM.  Appropriate orders placed.  Kevin Black was informed that the remainder of the evaluation will be completed by another provider, this initial triage assessment does not replace that evaluation, and the importance of remaining in the ED until their evaluation is complete.     Kevin Maize, PA-C 05/10/21 1619    Kevin Belling, MD 05/10/21 2115

## 2021-05-11 ENCOUNTER — Emergency Department (HOSPITAL_BASED_OUTPATIENT_CLINIC_OR_DEPARTMENT_OTHER): Payer: No Typology Code available for payment source

## 2021-05-11 ENCOUNTER — Other Ambulatory Visit: Payer: Self-pay

## 2021-05-11 ENCOUNTER — Encounter (HOSPITAL_BASED_OUTPATIENT_CLINIC_OR_DEPARTMENT_OTHER): Payer: Self-pay

## 2021-05-11 ENCOUNTER — Emergency Department (HOSPITAL_BASED_OUTPATIENT_CLINIC_OR_DEPARTMENT_OTHER)
Admission: EM | Admit: 2021-05-11 | Discharge: 2021-05-11 | Disposition: A | Discharge status: Home / Self Care | Payer: No Typology Code available for payment source | Source: Home / Self Care | Attending: Emergency Medicine | Admitting: Emergency Medicine

## 2021-05-11 DIAGNOSIS — N200 Calculus of kidney: Secondary | ICD-10-CM | POA: Insufficient documentation

## 2021-05-11 DIAGNOSIS — Z79899 Other long term (current) drug therapy: Secondary | ICD-10-CM | POA: Insufficient documentation

## 2021-05-11 DIAGNOSIS — N12 Tubulo-interstitial nephritis, not specified as acute or chronic: Secondary | ICD-10-CM

## 2021-05-11 DIAGNOSIS — N280 Ischemia and infarction of kidney: Secondary | ICD-10-CM

## 2021-05-11 LAB — COMPREHENSIVE METABOLIC PANEL
ALT: 37 U/L (ref 0–44)
AST: 18 U/L (ref 15–41)
Albumin: 4.2 g/dL (ref 3.5–5.0)
Alkaline Phosphatase: 60 U/L (ref 38–126)
Anion gap: 8 (ref 5–15)
BUN: 15 mg/dL (ref 6–20)
CO2: 28 mmol/L (ref 22–32)
Calcium: 9.6 mg/dL (ref 8.9–10.3)
Chloride: 102 mmol/L (ref 98–111)
Creatinine, Ser: 0.96 mg/dL (ref 0.61–1.24)
GFR, Estimated: >60 mL/min (ref >60)
Glucose, Bld: 101 mg/dL — ABNORMAL HIGH (ref 70–99)
Potassium: 3.6 mmol/L (ref 3.5–5.1)
Sodium: 138 mmol/L (ref 135–145)
Total Bilirubin: 1 mg/dL (ref 0.3–1.2)
Total Protein: 7.2 g/dL (ref 6.5–8.1)

## 2021-05-11 LAB — URINALYSIS, ROUTINE W REFLEX MICROSCOPIC
Glucose, UA: NEGATIVE mg/dL
Hgb urine dipstick: NEGATIVE
Nitrite: NEGATIVE
Protein, ur: 30 mg/dL — AB
Specific Gravity, Urine: 1.038 — ABNORMAL HIGH (ref 1.005–1.030)
pH: 6.5 (ref 5.0–8.0)

## 2021-05-11 LAB — CBC WITH DIFFERENTIAL/PLATELET
Abs Immature Granulocytes: 0.05 10*3/uL (ref 0.00–0.07)
Basophils Absolute: 0 10*3/uL (ref 0.0–0.1)
Basophils Relative: 0 %
Eosinophils Absolute: 0.3 10*3/uL (ref 0.0–0.5)
Eosinophils Relative: 2 %
HCT: 40.4 % (ref 39.0–52.0)
Hemoglobin: 13.6 g/dL (ref 13.0–17.0)
Immature Granulocytes: 0 %
Lymphocytes Relative: 11 %
Lymphs Abs: 1.5 10*3/uL (ref 0.7–4.0)
MCH: 28.9 pg (ref 26.0–34.0)
MCHC: 33.7 g/dL (ref 30.0–36.0)
MCV: 85.8 fL (ref 80.0–100.0)
Monocytes Absolute: 1.2 10*3/uL — ABNORMAL HIGH (ref 0.1–1.0)
Monocytes Relative: 9 %
Neutro Abs: 10.6 10*3/uL — ABNORMAL HIGH (ref 1.7–7.7)
Neutrophils Relative %: 78 %
Platelets: 266 10*3/uL (ref 150–400)
RBC: 4.71 MIL/uL (ref 4.22–5.81)
RDW: 12 % (ref 11.5–15.5)
WBC: 13.6 10*3/uL — ABNORMAL HIGH (ref 4.0–10.5)
nRBC: 0 % (ref 0.0–0.2)

## 2021-05-11 LAB — LIPASE, BLOOD: Lipase: 13 U/L (ref 11–51)

## 2021-05-11 MED ORDER — CEPHALEXIN 500 MG PO CAPS: 500.0000 mg | ORAL_CAPSULE | Freq: Four times a day (QID) | ORAL | 0 refills | Status: DC

## 2021-05-11 MED ORDER — ONDANSETRON HCL 4 MG/2ML IJ SOLN
4.0000 mg | Freq: Once | INTRAMUSCULAR | Status: AC
  Administered 2021-05-11: 4 mg via INTRAVENOUS
  Filled 2021-05-11: qty 2

## 2021-05-11 MED ORDER — IOHEXOL 300 MG/ML  SOLN
100.0000 mL | Freq: Once | INTRAMUSCULAR | Status: AC | PRN
  Administered 2021-05-11: 100 mL via INTRAVENOUS

## 2021-05-11 MED ORDER — FENTANYL CITRATE PF 50 MCG/ML IJ SOSY
50.0000 ug | PREFILLED_SYRINGE | Freq: Once | INTRAMUSCULAR | Status: AC
  Administered 2021-05-11: 50 ug via INTRAVENOUS
  Filled 2021-05-11: qty 1

## 2021-05-11 MED ORDER — OXYCODONE-ACETAMINOPHEN 5-325 MG PO TABS: 1.0000 | ORAL_TABLET | Freq: Four times a day (QID) | ORAL | 0 refills | Status: AC | PRN

## 2021-05-11 MED ORDER — SODIUM CHLORIDE 0.9 % IV SOLN
1.0000 g | Freq: Once | INTRAVENOUS | Status: AC
  Administered 2021-05-11: 1 g via INTRAVENOUS
  Filled 2021-05-11: qty 10

## 2021-05-11 NOTE — ED Triage Notes (Signed)
Abd pain x 8 days, right lower sharp shooting pain that is intermittent. No hx of same. Seen @ UC 4-5 days ago and was given a 'pain med & muscle relaxer'. Nausea without V/D.

## 2021-05-11 NOTE — Discharge Instructions (Addendum)
The work-up today was concerning for a kidney infection or possibly infarct in your kidney.  Please take the antibiotic as prescribed.  You will need close follow-up with the urologist and likely repeat CT imaging to monitor your kidney.  If at any point you develop uncontrolled pain, vomiting, fever, please come back to ER for reassessment.  Call urology office this afternoon to get an appointment.

## 2021-05-11 NOTE — ED Provider Notes (Signed)
[facility] EMERGENCY DEPT Provider Note   CSN: [id number] Arrival date & time: 05/11/21  6433     History Chief Complaint  Patient presents with   Abdominal Pain    Kevin Black is a 40 y.o. male.  Presents to ER with concern for abdominal pain.  Pain ongoing for the last 8 days.  Worse in right lower abdomen.  Intermittent, shooting pain.  Currently moderate.  Worse with eating.  Has nausea that is also worse with eating.  No vomiting today.  No fevers or chills.  Went to ER at [facility] yesterday but left before getting a room due to the very excessive wait time.  HPI     History reviewed. No pertinent past medical history.  Patient Active Problem List   Diagnosis Date Noted   Hemorrhoids 10/17/2019   Elevated liver enzymes 04/10/2019   Slow transit constipation 04/10/2019   Hematuria 08/21/2018   Tinea versicolor 08/21/2018   Alcohol use 08/21/2018   Atypical nevus 08/21/2018   Elevated LDL cholesterol level 08/21/2018   Healthcare maintenance 04/25/2018    Past Surgical History:  Procedure Laterality Date   FINGER SURGERY     SHOULDER SURGERY Right        History reviewed. No pertinent family history.  Social History   Tobacco Use   Smoking status: Never   Smokeless tobacco: Never  Vaping Use   Vaping Use: Never used  Substance Use Topics   Alcohol use: Yes    Comment: one drink daily.   Drug use: Never    Home Medications Prior to Admission medications   Medication Sig Start Date End Date Taking? Authorizing Provider  cephALEXin (KEFLEX) 500 MG capsule Take 1 capsule (500 mg total) by mouth 4 (four) times daily for 7 days. 05/11/21 05/18/21 Yes Tashira Torre, Ellwood Dense, MD  oxyCODONE-acetaminophen (PERCOCET/ROXICET) 5-325 MG tablet Take 1 tablet by mouth every 6 (six) hours as needed for up to 3 days for severe pain. 05/11/21 05/14/21 Yes Lucrezia Starch, MD  diclofenac (VOLTAREN) 75 MG EC tablet TAKE 1 TABLET BY MOUTH TWICE A DAY  09/10/20   Wallene Huh, DPM  ibuprofen (ADVIL) 600 MG tablet Take 1 tablet (600 mg total) by mouth 3 (three) times daily. 05/16/20   Nche, Charlene Brooke, NP  pramoxine-hydrocortisone (PROCTOCREAM-HC) 1-1 % rectal cream Place 1 application rectally 2 (two) times daily. 10/17/19   Libby Maw, MD  Psyllium 25 % POWD Take 30 mLs by mouth daily as needed. 04/10/19   Libby Maw, MD  selenium sulfide (SELSUN) 2.5 % shampoo Apply daily to rash and leave on for 1 hour and then wash off for one week. 08/21/18   Libby Maw, MD  witch hazel-glycerin (TUCKS) pad Wipe prior to applying proctocream and after stooling. 10/17/19   Libby Maw, MD    Allergies    Patient has no known allergies.  Review of Systems   Review of Systems  Constitutional:  Negative for chills and fever.  HENT:  Negative for ear pain and sore throat.   Eyes:  Negative for pain and visual disturbance.  Respiratory:  Negative for cough and shortness of breath.   Cardiovascular:  Negative for chest pain and palpitations.  Gastrointestinal:  Positive for abdominal pain and nausea. Negative for vomiting.  Genitourinary:  Negative for dysuria and hematuria.  Musculoskeletal:  Negative for arthralgias and back pain.  Skin:  Negative for color change and rash.  Neurological:  Negative for  seizures and syncope.  All other systems reviewed and are negative.  Physical Exam Updated Vital Signs BP 128/89   Pulse 62   Temp 97.7 F (36.5 C)   Resp 20   Ht 6\' 1"  (1.854 m)   Wt 92.5 kg   SpO2 98%   BMI 26.91 kg/m   Physical Exam Vitals and nursing note reviewed.  Constitutional:      Appearance: He is well-developed.  HENT:     Head: Normocephalic and atraumatic.  Eyes:     Conjunctiva/sclera: Conjunctivae normal.  Cardiovascular:     Rate and Rhythm: Normal rate and regular rhythm.     Heart sounds: No murmur heard. Pulmonary:     Effort: Pulmonary effort is normal. No  respiratory distress.     Breath sounds: Normal breath sounds.  Abdominal:     Palpations: Abdomen is soft.     Tenderness: There is abdominal tenderness in the right lower quadrant. There is no guarding or rebound.  Musculoskeletal:     Cervical back: Neck supple.  Skin:    General: Skin is warm and dry.  Neurological:     Mental Status: He is alert.    ED Results / Procedures / Treatments   Labs (all labs ordered are listed, but only abnormal results are displayed) Labs Reviewed  CBC WITH DIFFERENTIAL/PLATELET - Abnormal; Notable for the following components:      Result Value   WBC 13.6 (*)    Neutro Abs 10.6 (*)    Monocytes Absolute 1.2 (*)    All other components within normal limits  COMPREHENSIVE METABOLIC PANEL - Abnormal; Notable for the following components:   Glucose, Bld 101 (*)    All other components within normal limits  URINALYSIS, ROUTINE W REFLEX MICROSCOPIC - Abnormal; Notable for the following components:   Specific Gravity, Urine 1.038 (*)    Bilirubin Urine SMALL (*)    Ketones, ur TRACE (*)    Protein, ur 30 (*)    Leukocytes,Ua TRACE (*)    Bacteria, UA MANY (*)    All other components within normal limits  URINE CULTURE  LIPASE, BLOOD    EKG None  Radiology CT ABDOMEN PELVIS W CONTRAST  Result Date: 05/11/2021 CLINICAL DATA:  Right lower quadrant abdominal pain for 8 days. Appendicitis suspected. Nausea without vomiting or diarrhea. EXAM: CT ABDOMEN AND PELVIS WITH CONTRAST TECHNIQUE: Multidetector CT imaging of the abdomen and pelvis was performed using the standard protocol following bolus administration of intravenous contrast. CONTRAST:  189mL OMNIPAQUE IOHEXOL 300 MG/ML  SOLN COMPARISON:  None. FINDINGS: Lower chest: Clear lung bases. No significant pleural or pericardial effusion. Hepatobiliary: The liver is normal in density without suspicious focal abnormality. No evidence of gallstones, gallbladder wall thickening or biliary dilatation.  Pancreas: Unremarkable. No pancreatic ductal dilatation or surrounding inflammatory changes. Spleen: Normal in size without focal abnormality. Small splenules are noted. Adrenals/Urinary Tract: Both adrenal glands appear normal. There is a somewhat wedge-shaped area of decreased attenuation involving the lower pole of the right kidney anteriorly, measuring up to 4.3 x 3.5 cm transverse on image 47/2. This appearance is most consistent with a subacute segmental infarct, and no mass lesion is identified. There is a 1.3 cm simple cyst posteriorly in the lower pole of the right kidney. There are probable additional tiny cysts bilaterally. No evidence of urinary tract calculus or hydronephrosis. The bladder appears unremarkable. Stomach/Bowel: No enteric contrast administered. The stomach appears unremarkable for its degree of distension. No evidence  of bowel wall thickening, distention or surrounding inflammatory change. The appendix appears normal. Vascular/Lymphatic: There are no enlarged abdominal or pelvic lymph nodes. No significant vascular findings are identified. Specifically, a single right renal artery and vein appear patent. The IVC appears patent. Reproductive: The prostate gland and seminal vesicles appear unremarkable. Other: No evidence of abdominal wall mass or hernia. No ascites. Minimal perinephric soft tissue stranding inferiorly on the right without focal fluid collection. Musculoskeletal: No acute or significant osseous findings. Degenerative disc disease at L5-S1. IMPRESSION: 1. No evidence of acute appendicitis. 2. Abnormal appearance of the lower pole of the right kidney, most consistent with a subacute segmental infarct. Focal pyelonephritis less likely, and there is no focal fluid collection or urinary tract obstruction. No associated large vessel occlusion identified. Suggest urology follow-up. 3. Probable small renal cysts bilaterally. Electronically Signed   By: Richardean Sale M.D.   On:  05/11/2021 11:16    Procedures Procedures   Medications Ordered in ED Medications  fentaNYL (SUBLIMAZE) injection 50 mcg (50 mcg Intravenous Given 05/11/21 1022)  ondansetron (ZOFRAN) injection 4 mg (4 mg Intravenous Given 05/11/21 1022)  iohexol (OMNIPAQUE) 300 MG/ML solution 100 mL (100 mLs Intravenous Contrast Given 05/11/21 1037)  cefTRIAXone (ROCEPHIN) 1 g in sodium chloride 0.9 % 100 mL IVPB (0 g Intravenous Stopped 05/11/21 1255)    ED Course  I have reviewed the triage vital signs and the nursing notes.  Pertinent labs & imaging results that were available during my care of the patient were reviewed by me and considered in my medical decision making (see chart for details).    MDM Rules/Calculators/A&P                           40 year old male presenting to ER with concern for right lower quadrant abdominal pain.  On exam he appears well in no distress, was noted to have right lower quadrant tenderness.  Obtain labs, CT, urinalysis.  Labs concerning for leukocytosis, slightly improved from yesterday.  UA with likely UTI.  CT scan with abnormal appearance of lower lobe of right kidney, radiologist stated likely subacute infarct or less likely focal pyelonephritis.  Consulted urology, discussed case in detail with Dr. Claudia Desanctis who reviewed imaging.  She recommended treatment for pyelonephritis and outpatient urology follow-up, likely will repeat imaging from clinic. If patient otherwise stable for outpatient treatment for UTI/Pyelo, then okay for discharge and does not necessarily need admission for this finding on CT. on reassessment, patient remains well-appearing and his symptoms are very well controlled.  Tolerating p.o. without difficulty.  No fever, no tachycardia.  Gave dose of Rocephin in ER and provided course of Keflex.  Discussed CT findings with patient and need for close follow-up with urology.  Patient demonstrated good understanding of these instructions and return  precautions and was discharged home.  After the discussed management above, the patient was determined to be safe for discharge.  The patient was in agreement with this plan and all questions regarding their care were answered.  ED return precautions were discussed and the patient will return to the ED with any significant worsening of condition.  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

## 2021-05-11 NOTE — ED Notes (Signed)
Patient verbalizes understanding of discharge instructions. Opportunity for questioning and answers were provided. Patient discharged from ED.  °

## 2021-05-11 NOTE — ED Notes (Signed)
Pt left AMA °

## 2021-05-12 LAB — URINE CULTURE: Culture: NO GROWTH

## 2021-05-15 ENCOUNTER — Encounter: Payer: Self-pay | Admitting: Family Medicine

## 2021-05-15 ENCOUNTER — Ambulatory Visit (INDEPENDENT_AMBULATORY_CARE_PROVIDER_SITE_OTHER): Payer: No Typology Code available for payment source | Admitting: Family Medicine

## 2021-05-15 ENCOUNTER — Other Ambulatory Visit: Payer: Self-pay

## 2021-05-15 VITALS — BP 132/86 | HR 75 | Temp 97.0°F | Ht 73.0 in | Wt 202.6 lb

## 2021-05-15 DIAGNOSIS — N280 Ischemia and infarction of kidney: Secondary | ICD-10-CM

## 2021-05-15 DIAGNOSIS — E7800 Pure hypercholesterolemia, unspecified: Secondary | ICD-10-CM

## 2021-05-15 DIAGNOSIS — Z8279 Family history of other congenital malformations, deformations and chromosomal abnormalities: Secondary | ICD-10-CM

## 2021-05-15 DIAGNOSIS — R319 Hematuria, unspecified: Secondary | ICD-10-CM | POA: Diagnosis not present

## 2021-05-15 DIAGNOSIS — Z0000 Encounter for general adult medical examination without abnormal findings: Secondary | ICD-10-CM

## 2021-05-15 HISTORY — DX: Ischemia and infarction of kidney: N28.0

## 2021-05-15 HISTORY — DX: Family history of other congenital malformations, deformations and chromosomal abnormalities: Z82.79

## 2021-05-15 NOTE — Progress Notes (Signed)
Established Patient Office Visit  Subjective:  Patient ID: Kevin Black, male    DOB: 12/03/1980  Age: 40 y.o. MRN: [id number]  CC:  Chief Complaint  Patient presents with   Hospitalization [facility] follow up seen for bladder infection. Patient would like referral to cardiologist due to mother just recently having pacemaker placed. Also would like referral to urologist     HPI Kevin Black presents for follow-up of after recent emergency room visit 4 days ago for right lower abdominal pain.  He was diagnosed with pyelonephritis and continues antibiotic therapy.  Urinalysis showed multiple bacteria but culture was negative.  CT scanning obtained which showed the question of infarction in the lower pole of his right kidney.  Mom has a history of congenital heart disease and is status post recent pacemaker placement.  Patient is uncertain of what is going on exactly with his mother's heart.  Her cardiologist suggested that the kids be checked.  Patient has no chest pain shortness of breath or dyspnea on exertion.  He is active physically.  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  Personal life is in better shape than last visit.  He has a stable separate ration agreement with his ex and is able to see his young daughter every other day.  No alcohol for 3 months now.  No past medical history on file.  Past Surgical History:  Procedure Laterality Date   FINGER SURGERY     SHOULDER SURGERY Right     No family history on file.  Social History   Socioeconomic History   Marital status: Married    Spouse name: Not on file   Number of children: Not on file   Years of education: Not on file   Highest education level: Not on file  Occupational History   Not on file  Tobacco Use   Smoking status: Never   Smokeless tobacco: Never  Vaping Use   Vaping Use: Never used  Substance and Sexual Activity   Alcohol use: Yes    Comment: one drink daily.   Drug use: Never    Sexual activity: Not on file  Other Topics Concern   Not on file  Social History Narrative   Not on file   Social Determinants of Health   Financial Resource Strain: Not on file  Food Insecurity: Not on file  Transportation Needs: Not on file  Physical Activity: Not on file  Stress: Not on file  Social Connections: Not on file  Intimate Partner Violence: Not on file    Outpatient Medications Prior to Visit  Medication Sig Dispense Refill   cephALEXin (KEFLEX) 500 MG capsule Take 500 mg by mouth 4 (four) times daily.     cephALEXin (KEFLEX) 500 MG capsule Take 1 capsule (500 mg total) by mouth 4 (four) times daily for 7 days. 28 capsule 0   ibuprofen (ADVIL) 600 MG tablet Take 1 tablet (600 mg total) by mouth 3 (three) times daily. 30 tablet 0   diclofenac (VOLTAREN) 75 MG EC tablet TAKE 1 TABLET BY MOUTH TWICE A DAY (Patient not taking: Reported on 05/15/2021) 50 tablet 2   pramoxine-hydrocortisone (PROCTOCREAM-HC) 1-1 % rectal cream Place 1 application rectally 2 (two) times daily. 30 g 0   Psyllium 25 % POWD Take 30 mLs by mouth daily as needed. 368 g 2   selenium sulfide (SELSUN) 2.5 % shampoo Apply daily to rash and leave on for 1 hour  and then wash off for one week. 118 mL 5   witch hazel-glycerin (TUCKS) pad Wipe prior to applying proctocream and after stooling. 100 each 12   No facility-administered medications prior to visit.    No Known Allergies  ROS Review of Systems  Constitutional:  Negative for chills, diaphoresis, fatigue, fever and unexpected weight change.  HENT: Negative.    Eyes:  Negative for photophobia and visual disturbance.  Respiratory:  Negative for chest tightness, shortness of breath and wheezing.   Cardiovascular:  Negative for chest pain and palpitations.  Gastrointestinal:  Negative for abdominal pain, nausea and vomiting.  Endocrine: Negative for polyphagia and polyuria.  Genitourinary:  Negative for difficulty urinating, dysuria, frequency  and hematuria.  Musculoskeletal:  Negative for gait problem and joint swelling.  Neurological:  Negative for speech difficulty and weakness.  Psychiatric/Behavioral: Negative.       Objective:    Physical Exam Vitals and nursing note reviewed.  Constitutional:      General: He is not in acute distress.    Appearance: Normal appearance. He is not ill-appearing, toxic-appearing or diaphoretic.  HENT:     Head: Normocephalic and atraumatic.     Right Ear: Tympanic membrane, ear canal and external ear normal.     Left Ear: Tympanic membrane, ear canal and external ear normal.     Mouth/Throat:     Mouth: Mucous membranes are moist.     Pharynx: Oropharynx is clear. No oropharyngeal exudate or posterior oropharyngeal erythema.  Eyes:     General: No scleral icterus.       Right eye: No discharge.        Left eye: No discharge.     Extraocular Movements: Extraocular movements intact.     Conjunctiva/sclera: Conjunctivae normal.     Pupils: Pupils are equal, round, and reactive to light.  Neck:     Vascular: No carotid bruit.  Cardiovascular:     Rate and Rhythm: Normal rate and regular rhythm.     Pulses: Normal pulses.     Heart sounds: Normal heart sounds.  Pulmonary:     Effort: Pulmonary effort is normal.     Breath sounds: Normal breath sounds.  Abdominal:     General: Bowel sounds are normal.     Tenderness: There is no right CVA tenderness.  Musculoskeletal:     Cervical back: No rigidity or tenderness.  Lymphadenopathy:     Cervical: No cervical adenopathy.  Skin:    General: Skin is warm and dry.  Neurological:     Mental Status: He is alert and oriented to person, place, and time.  Psychiatric:        Mood and Affect: Mood normal.        Behavior: Behavior normal.    BP 132/86 (BP Location: Right Arm, Patient Position: Sitting, Cuff Size: Normal)   Pulse 75   Temp (!) 97 F (36.1 C) (Temporal)   Ht 6\' 1"  (1.854 m)   Wt 202 lb 9.6 oz (91.9 kg)   SpO2 98%    BMI 26.73 kg/m  Wt Readings from Last 3 Encounters:  05/15/21 202 lb 9.6 oz (91.9 kg)  05/11/21 204 lb (92.5 kg)  05/16/20 227 lb 6.4 oz (103.1 kg)     Health Maintenance Due  Topic Date Due   Pneumococcal Vaccine 16-80 Years old (1 - PCV) Never done   Hepatitis C Screening  Never done   TETANUS/TDAP  Never done    There are no  preventive care reminders to display for this patient.  No results found for: TSH Lab Results  Component Value Date   WBC 13.6 (H) 05/11/2021   HGB 13.6 05/11/2021   HCT 40.4 05/11/2021   MCV 85.8 05/11/2021   PLT 266 05/11/2021   Lab Results  Component Value Date   NA 138 05/11/2021   K 3.6 05/11/2021   CO2 28 05/11/2021   GLUCOSE 101 (H) 05/11/2021   BUN 15 05/11/2021   CREATININE 0.96 05/11/2021   BILITOT 1.0 05/11/2021   ALKPHOS 60 05/11/2021   AST 18 05/11/2021   ALT 37 05/11/2021   PROT 7.2 05/11/2021   ALBUMIN 4.2 05/11/2021   CALCIUM 9.6 05/11/2021   ANIONGAP 8 05/11/2021   GFR 91.26 04/10/2019   Lab Results  Component Value Date   CHOL 247 (H) 04/10/2019   Lab Results  Component Value Date   HDL 57.30 04/10/2019   Lab Results  Component Value Date   LDLCALC 160 (H) 04/10/2019   Lab Results  Component Value Date   TRIG 147.0 04/10/2019   Lab Results  Component Value Date   CHOLHDL 4 04/10/2019   No results found for: HGBA1C    Assessment & Plan:   Problem List Items Addressed This Visit       Cardiovascular and Mediastinum   Renal infarction [facility])   Relevant Orders   Ambulatory referral to Urology     Other   Healthcare maintenance   Relevant Orders   CBC   Comprehensive metabolic panel   Hematuria - Primary   Relevant Orders   Urinalysis, Routine w reflex microscopic   Ambulatory referral to Urology   Elevated LDL cholesterol level   Relevant Orders   Comprehensive metabolic panel   Lipid panel   LDL cholesterol, direct   Family history of congenital heart disease in mother   Relevant  Orders   Ambulatory referral to Cardiology    No orders of the defined types were placed in this encounter.   Follow-up: Return in about 3 months (around 08/15/2021), or Return fasting next Thursday for labs.Libby Maw, MD

## 2021-05-21 ENCOUNTER — Other Ambulatory Visit: Payer: Self-pay

## 2021-05-21 ENCOUNTER — Other Ambulatory Visit (INDEPENDENT_AMBULATORY_CARE_PROVIDER_SITE_OTHER): Payer: No Typology Code available for payment source

## 2021-05-21 DIAGNOSIS — E7800 Pure hypercholesterolemia, unspecified: Secondary | ICD-10-CM

## 2021-05-21 DIAGNOSIS — R319 Hematuria, unspecified: Secondary | ICD-10-CM

## 2021-05-21 DIAGNOSIS — Z0000 Encounter for general adult medical examination without abnormal findings: Secondary | ICD-10-CM

## 2021-05-21 LAB — COMPREHENSIVE METABOLIC PANEL
ALT: 26 U/L (ref 0–53)
AST: 22 U/L (ref 0–37)
Albumin: 4.4 g/dL (ref 3.5–5.2)
Alkaline Phosphatase: 67 U/L (ref 39–117)
BUN: 14 mg/dL (ref 6–23)
CO2: 28 mEq/L (ref 19–32)
Calcium: 9.4 mg/dL (ref 8.4–10.5)
Chloride: 104 mEq/L (ref 96–112)
Creatinine, Ser: 1.09 mg/dL (ref 0.40–1.50)
GFR: 84.13 mL/min (ref >60.00)
Glucose, Bld: 88 mg/dL (ref 70–99)
Potassium: 4.2 mEq/L (ref 3.5–5.1)
Sodium: 139 mEq/L (ref 135–145)
Total Bilirubin: 0.5 mg/dL (ref 0.2–1.2)
Total Protein: 7.3 g/dL (ref 6.0–8.3)

## 2021-05-21 LAB — LDL CHOLESTEROL, DIRECT: Direct LDL: 154 mg/dL

## 2021-05-21 LAB — CBC
HCT: 42.4 % (ref 39.0–52.0)
Hemoglobin: 14.1 g/dL (ref 13.0–17.0)
MCHC: 33.2 g/dL (ref 30.0–36.0)
MCV: 87 fl (ref 78.0–100.0)
Platelets: 383 10*3/uL (ref 150.0–400.0)
RBC: 4.88 Mil/uL (ref 4.22–5.81)
RDW: 12.6 % (ref 11.5–15.5)
WBC: 16 10*3/uL — ABNORMAL HIGH (ref 4.0–10.5)

## 2021-05-21 LAB — URINALYSIS, ROUTINE W REFLEX MICROSCOPIC
Bilirubin Urine: NEGATIVE
Ketones, ur: NEGATIVE
Leukocytes,Ua: NEGATIVE
Nitrite: NEGATIVE
Specific Gravity, Urine: 1.02 (ref 1.000–1.030)
Total Protein, Urine: NEGATIVE
Urine Glucose: NEGATIVE
Urobilinogen, UA: 0.2 (ref 0.0–1.0)
pH: 6 (ref 5.0–8.0)

## 2021-05-21 LAB — LIPID PANEL
Cholesterol: 215 mg/dL — ABNORMAL HIGH (ref 0–200)
HDL: 42.5 mg/dL (ref >39.00)
LDL Cholesterol: 157 mg/dL — ABNORMAL HIGH (ref 0–99)
NonHDL: 172.27
Total CHOL/HDL Ratio: 5
Triglycerides: 75 mg/dL (ref 0.0–149.0)
VLDL: 15 mg/dL (ref 0.0–40.0)

## 2021-06-10 ENCOUNTER — Encounter: Payer: Self-pay | Admitting: Cardiology

## 2021-06-10 ENCOUNTER — Other Ambulatory Visit: Payer: Self-pay

## 2021-06-10 ENCOUNTER — Ambulatory Visit (INDEPENDENT_AMBULATORY_CARE_PROVIDER_SITE_OTHER): Payer: No Typology Code available for payment source | Admitting: Cardiology

## 2021-06-10 VITALS — BP 120/92 | HR 76 | Ht 73.0 in | Wt 196.0 lb

## 2021-06-10 DIAGNOSIS — R011 Cardiac murmur, unspecified: Secondary | ICD-10-CM | POA: Diagnosis not present

## 2021-06-10 DIAGNOSIS — E7800 Pure hypercholesterolemia, unspecified: Secondary | ICD-10-CM | POA: Diagnosis not present

## 2021-06-10 DIAGNOSIS — R0789 Other chest pain: Secondary | ICD-10-CM | POA: Diagnosis not present

## 2021-06-10 NOTE — Patient Instructions (Signed)
Medication Instructions:  Your physician recommends that you continue on your current medications as directed. Please refer to the Current Medication list given to you today.  *If you need a refill on your cardiac medications before your next appointment, please call your pharmacy*   Lab Work: None ordered If you have labs (blood work) drawn today and your tests are completely normal, you will receive your results only by: [place] (if you have MyChart) OR A paper copy in the mail If you have any lab test that is abnormal or we need to change your treatment, we will call you to review the results.   Testing/Procedures: Your physician has requested that you have an echocardiogram. Echocardiography is a painless test that uses sound waves to create images of your heart. It provides your doctor with information about the size and shape of your heart and how well your heart's chambers and valves are working. This procedure takes approximately one hour. There are no restrictions for this procedure.      Stress Echocardiogram Information Sheet                                                      Instructions:    1. You may take your morning medications the morning of the test  2. Light breakfast no caffeine  3. Dress prepared to exercise.  4. DO NOT use ANY caffeine or tobacco products 3 hours before appointment.  5. Please bring all current prescription medications.   Follow-Up: At [facility] [place], you and your health needs are our priority.  As part of our continuing mission to provide you with exceptional heart care, we have created designated Provider Care Teams.  These Care Teams include your primary Cardiologist (physician) and Advanced Practice Providers (APPs -  Physician Assistants and Nurse Practitioners) who all work together to provide you with the care you need, when you need it.  We recommend signing up for the patient portal called "MyChart".  Sign up information is  provided on this After Visit Summary.  MyChart is used to connect with patients for Virtual Visits (Telemedicine).  Patients are able to view lab/test results, encounter notes, upcoming appointments, etc.  Non-urgent messages can be sent to your provider as well.   To learn more about what you can do with MyChart, go to [email or web address].    Your next appointment:   9 month(s)  The format for your next appointment:   In Person  Provider:   Jyl Heinz, MD   Other Instructions Echocardiogram An echocardiogram is a test that uses sound waves (ultrasound) to produce images of the heart. Images from an echocardiogram can provide important information about: Heart size and shape. The size and thickness and movement of your heart's walls. Heart muscle function and strength. Heart valve function or if you have stenosis. Stenosis is when the heart valves are too narrow. If blood is flowing backward through the heart valves (regurgitation). A tumor or infectious growth around the heart valves. Areas of heart muscle that are not working well because of poor blood flow or injury from a heart attack. Aneurysm detection. An aneurysm is a weak or damaged part of an artery wall. The wall bulges out from the normal force of blood pumping through the body. Tell a health care provider about: Any allergies you  have. All medicines you are taking, including vitamins, herbs, eye drops, creams, and over-the-counter medicines. Any blood disorders you have. Any surgeries you have had. Any medical conditions you have. Whether you are pregnant or may be pregnant. What are the risks? Generally, this is a safe test. However, problems may occur, including an allergic reaction to dye (contrast) that may be used during the test. What happens before the test? No specific preparation is needed. You may eat and drink normally. What happens during the test? You will take off your clothes from the waist up  and put on a hospital gown. Electrodes or electrocardiogram (ECG)patches may be placed on your chest. The electrodes or patches are then connected to a device that monitors your heart rate and rhythm. You will lie down on a table for an ultrasound exam. A gel will be applied to your chest to help sound waves pass through your skin. A handheld device, called a transducer, will be pressed against your chest and moved over your heart. The transducer produces sound waves that travel to your heart and bounce back (or "echo" back) to the transducer. These sound waves will be captured in real-time and changed into images of your heart that can be viewed on a video monitor. The images will be recorded on a computer and reviewed by your health care provider. You may be asked to change positions or hold your breath for a short time. This makes it easier to get different views or better views of your heart. In some cases, you may receive contrast through an IV in one of your veins. This can improve the quality of the pictures from your heart. The procedure may vary among health care providers and hospitals.   What can I expect after the test? You may return to your normal, everyday life, including diet, activities, and medicines, unless your health care provider tells you not to do that. Follow these instructions at home: It is up to you to get the results of your test. Ask your health care provider, or the department that is doing the test, when your results will be ready. Keep all follow-up visits. This is important. Summary An echocardiogram is a test that uses sound waves (ultrasound) to produce images of the heart. Images from an echocardiogram can provide important information about the size and shape of your heart, heart muscle function, heart valve function, and other possible heart problems. You do not need to do anything to prepare before this test. You may eat and drink normally. After the  echocardiogram is completed, you may return to your normal, everyday life, unless your health care provider tells you not to do that. This information is not intended to replace advice given to you by your health care provider. Make sure you discuss any questions you have with your health care provider. Document Revised: 03/04/2020 Document Reviewed: 03/04/2020 Elsevier Patient Education  2021 [employer].

## 2021-06-10 NOTE — Progress Notes (Signed)
Cardiology Office Note:    Date:  06/10/2021   ID:  Kevin Black, DOB January 21, 1981, MRN [id number]  PCP:  Libby Maw, MD  Cardiologist:  Jenean Lindau, MD   Referring MD: Libby Maw,*    ASSESSMENT:    1. Elevated LDL cholesterol level   2. Cardiac murmur   3. Atypical chest pain    PLAN:    In order of problems listed above:  Primary prevention stressed with the patient.  Importance of compliance with diet medication stressed any vocalized understanding.  He was advised to walk on a regular basis after the stress testing was done. Atypical chest pain: He is concerned about it and therefore we will do a exercise stress echo.  He has risk factors for coronary artery disease namely elevated cholesterol. Elevated LDL: This is significant and I explained this to him.  Diet was emphasized.  Risks explained and he promises to do better.  I told him that once his stress test results were out and we told him that he was good with the results that he could start an exercise program in a graded fashion and he agrees Cardiac murmur: Echocardiogram: Reassess murmur heard on auscultation. Patient will be seen in follow-up appointment in 6 months or earlier if the patient has any concerns    Medication Adjustments/Labs and Tests Ordered: Current medicines are reviewed at length with the patient today.  Concerns regarding medicines are outlined above.  No orders of the defined types were placed in this encounter.  No orders of the defined types were placed in this encounter.    History of Present Illness:    Kevin Black is a 40 y.o. male who is being seen today for the evaluation of chest pain and mixed dyslipidemia at the request of Libby Maw,*.  Patient is a pleasant 40 year old male.  He has past medical history of mixed dyslipidemia.  He tells me that his mother had a hole in the heart and had it fixed and was told that her children needs to get  there heart checked and therefore is here for evaluation.  He denies any chest pain orthopnea or PND.  Occasionally will have chest discomfort.  He does not exercise on a regular basis.  He runs a [employer].  His chest discomfort is not related to exertion.  No radiation to the neck or to the arms.  At the time of my evaluation, the patient is alert awake oriented and in no distress.   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     Allergies:   Patient has no known allergies.   Social History   Socioeconomic History   Marital status: Married    Spouse name: Not on file   Number of children: Not on file   Years of education: Not on file  Highest education level: Not on file  Occupational History   Not on file  Tobacco Use   Smoking status: Never   Smokeless tobacco: Never  Vaping Use   Vaping Use: Never used  Substance and Sexual Activity   Alcohol use: Never   Drug use: Never   Sexual activity: Not on file  Other Topics Concern   Not on file  Social History Narrative   Not on file   Social Determinants of Health   Financial Resource Strain: Not on file  Food Insecurity: Not on file  Transportation Needs: Not on file  Physical Activity: Not on file  Stress: Not on file  Social Connections: Not on file     Family History: The patient's  family history includes Asthma in his father; Cancer in his sister; Heart disease in his mother.  ROS:   Please see the history of present illness.    All other systems reviewed and are negative.  EKGs/Labs/Other Studies Reviewed:    The following studies were reviewed today: EKG was sinus rhythm with nonspecific ST-T changes   Recent Labs: 05/21/2021: ALT 26; BUN 14; Creatinine, Ser 1.09; Hemoglobin 14.1; Platelets 383.0; Potassium 4.2; Sodium 139  Recent Lipid Panel    Component Value Date/Time   CHOL 215 (H) 05/21/2021 0812   TRIG 75.0 05/21/2021 0812   HDL 42.50 05/21/2021 0812   CHOLHDL 5 05/21/2021 0812   VLDL 15.0 05/21/2021 0812   LDLCALC 157 (H) 05/21/2021 0812   LDLDIRECT 154.0 05/21/2021 0812    Physical Exam:    VS:  BP (!) 120/92 (BP Location: Left Arm, Patient Position: Sitting, Cuff Size: Normal)   Pulse 76   Ht 6\' 1"  (1.854 m)   Wt 196 lb (88.9 kg)   SpO2 97%   BMI 25.86 kg/m     Wt Readings from Last 3 Encounters:  06/10/21 196 lb (88.9 kg)  05/15/21 202 lb 9.6 oz (91.9 kg)  05/11/21 204 lb (92.5 kg)     GEN: Patient is in no acute distress HEENT: Normal NECK: No JVD; No carotid bruits LYMPHATICS: No lymphadenopathy CARDIAC: S1 S2 regular, 2/6 systolic murmur at the apex. RESPIRATORY:  Clear to auscultation without rales, wheezing or rhonchi  ABDOMEN: Soft, non-tender, non-distended MUSCULOSKELETAL:  No edema; No deformity  SKIN: Warm and dry NEUROLOGIC:  Alert and oriented x 3 PSYCHIATRIC:  Normal affect    Signed, Jenean Lindau, MD  06/10/2021 3:10 PM    [facility]

## 2021-07-07 ENCOUNTER — Telehealth (HOSPITAL_COMMUNITY): Payer: Self-pay | Admitting: *Deleted

## 2021-07-07 ENCOUNTER — Encounter (HOSPITAL_COMMUNITY): Payer: Self-pay | Admitting: *Deleted

## 2021-07-07 NOTE — Telephone Encounter (Signed)
Attempted to call patient regarding upcoming appointment- no answer, unable to leave a message. Letter sent with instructions via mychart.  Wyatt Galvan Jacqueline  

## 2021-07-10 ENCOUNTER — Other Ambulatory Visit (HOSPITAL_BASED_OUTPATIENT_CLINIC_OR_DEPARTMENT_OTHER): Payer: No Typology Code available for payment source

## 2021-07-13 ENCOUNTER — Telehealth (HOSPITAL_COMMUNITY): Payer: Self-pay | Admitting: *Deleted

## 2021-07-13 ENCOUNTER — Ambulatory Visit (HOSPITAL_BASED_OUTPATIENT_CLINIC_OR_DEPARTMENT_OTHER): Admission: RE | Admit: 2021-07-13 | Payer: No Typology Code available for payment source | Source: Ambulatory Visit

## 2021-07-13 NOTE — Telephone Encounter (Signed)
Attempted to call patient regarding upcoming appointment- no answer unable to leave a message.  Kevin Black

## 2021-07-14 ENCOUNTER — Other Ambulatory Visit (HOSPITAL_COMMUNITY): Payer: No Typology Code available for payment source

## 2021-07-14 ENCOUNTER — Encounter (HOSPITAL_COMMUNITY): Payer: Self-pay | Admitting: Cardiology

## 2021-08-17 ENCOUNTER — Ambulatory Visit: Payer: No Typology Code available for payment source | Admitting: Family Medicine

## 2021-09-23 ENCOUNTER — Telehealth: Payer: Self-pay | Admitting: Podiatry

## 2021-09-23 NOTE — Telephone Encounter (Signed)
Called patient, unable to lvm to call back and schedule an appointment to pick up orthotics  ?

## 2021-11-03 IMAGING — CT CT ABD-PELV W/ CM
2 of 5 series · 15 of 46 positions shown, 17 images · IV contrast (APPLIED)
Comparison: None.

CLINICAL DATA: Right lower quadrant abdominal pain for 8 days.
Appendicitis suspected. Nausea without vomiting or diarrhea.

EXAM:
CT ABDOMEN AND PELVIS WITH CONTRAST
TECHNIQUE: Multidetector CT imaging of the abdomen and pelvis was performed
using the standard protocol following bolus administration of
intravenous contrast.
CONTRAST:  100mL OMNIPAQUE IOHEXOL 300 MG/ML  SOLN

[Series 2: abd pel w · axial · 0.76mm/px · z∈[-264,+206]mm · 12 of 106 slices shown, 14 images]
[im 6/106  soft-tissue]
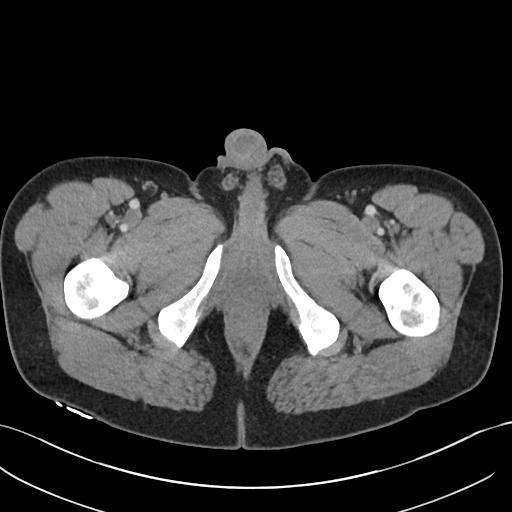
[im 6/106  bone]
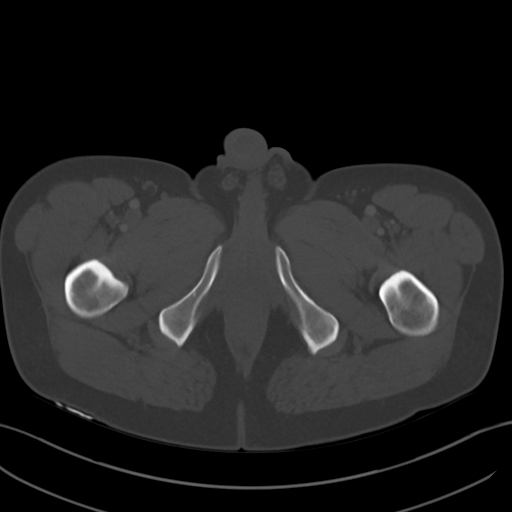
[im 17/106  soft-tissue]
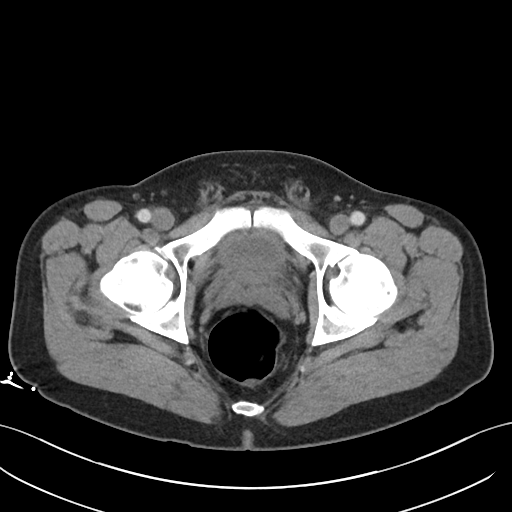
[im 23/106  soft-tissue]
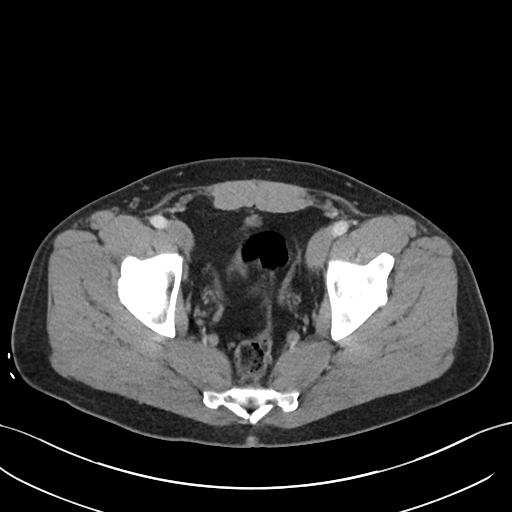
[im 34/106  soft-tissue]
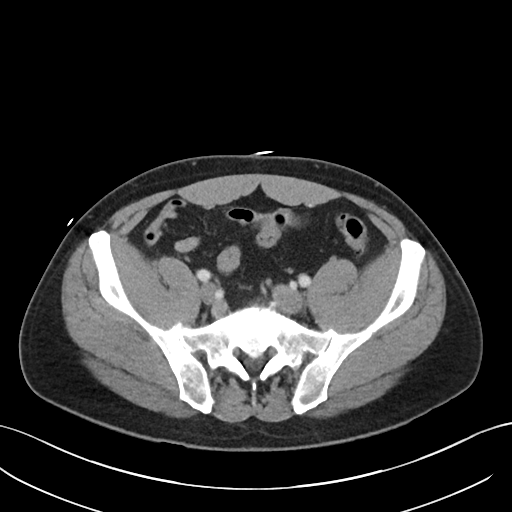
[im 39/106  soft-tissue]
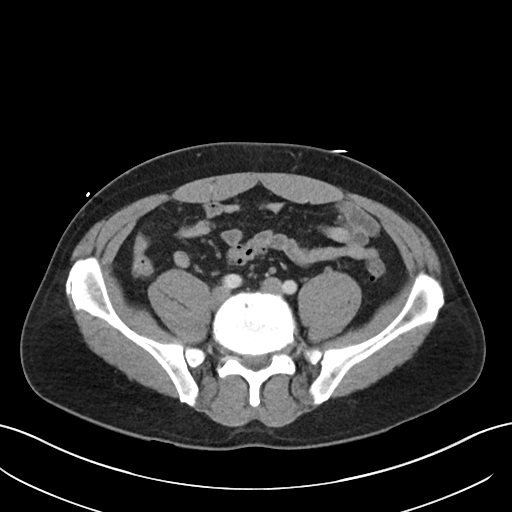
[im 50/106  soft-tissue]
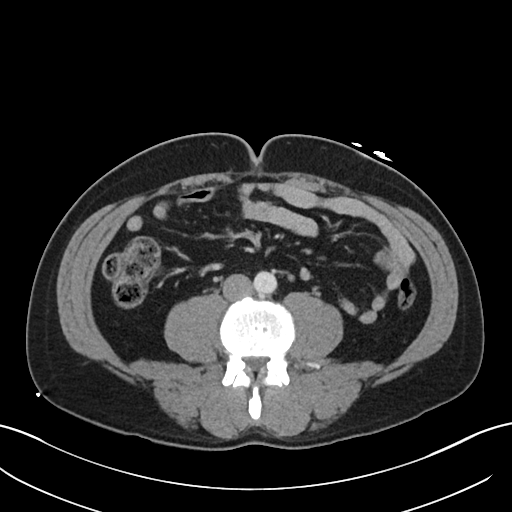
[im 56/106  soft-tissue]
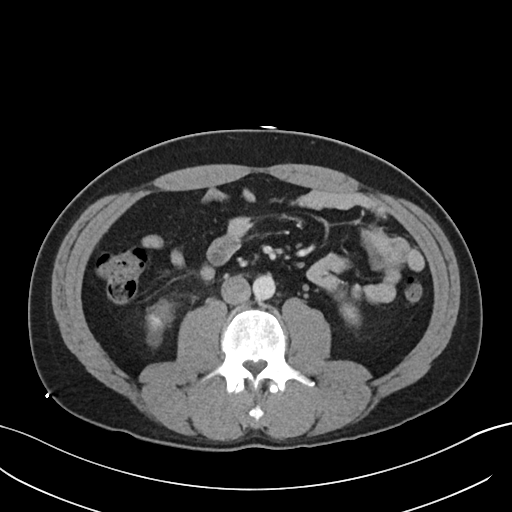
[im 67/106  soft-tissue]
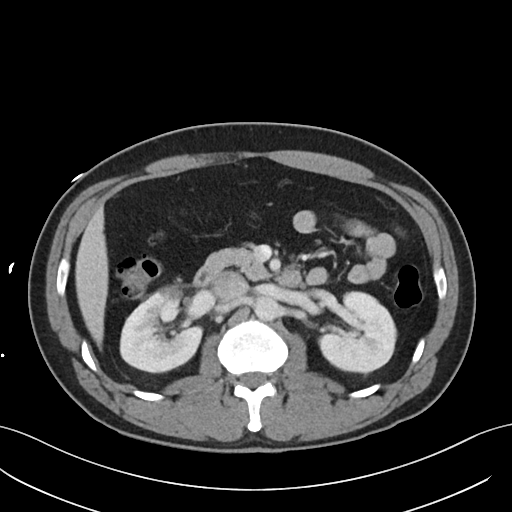
[im 72/106  soft-tissue]
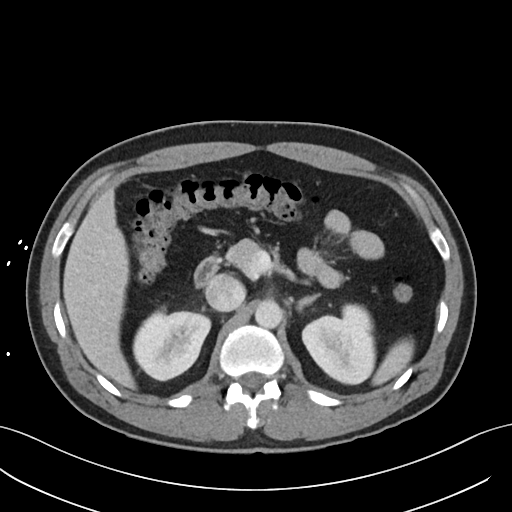
[im 72/106  bone]
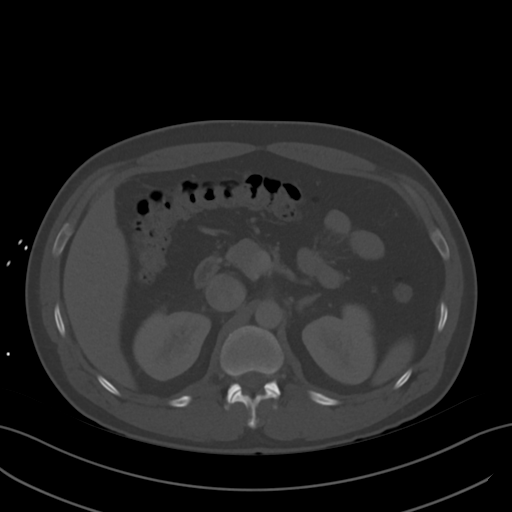
[im 83/106  soft-tissue]
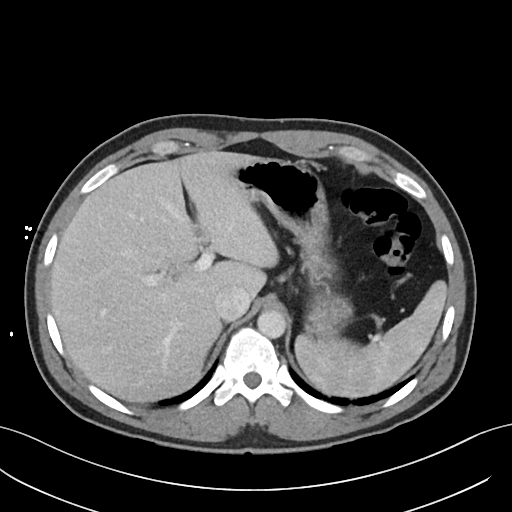
[im 89/106  soft-tissue]
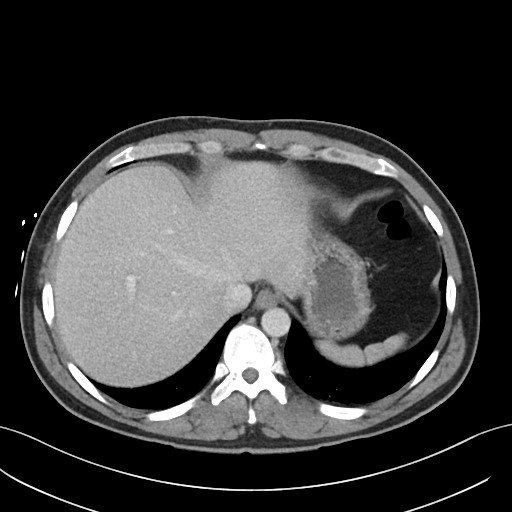
[im 100/106  soft-tissue]
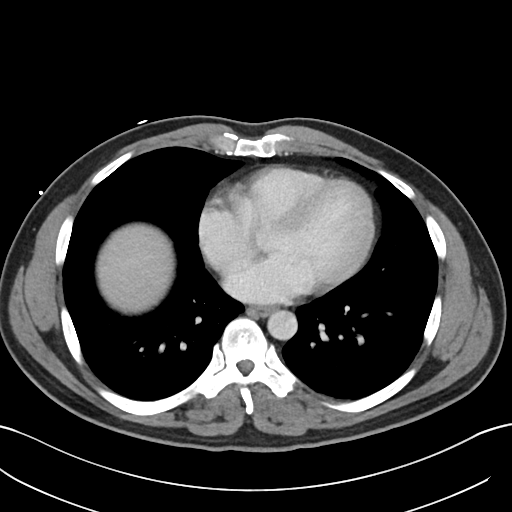

[Series 5: coronal · coronal · 0.80mm/px · 3 of 93 slices shown]
[im 31/93  soft-tissue]
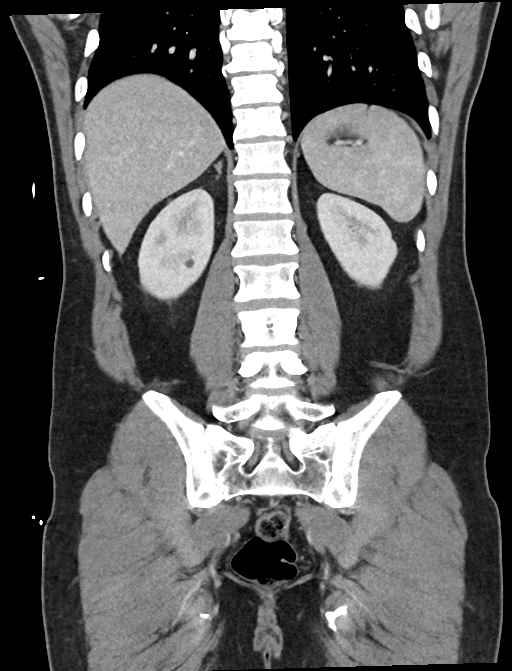
[im 41/93  soft-tissue]
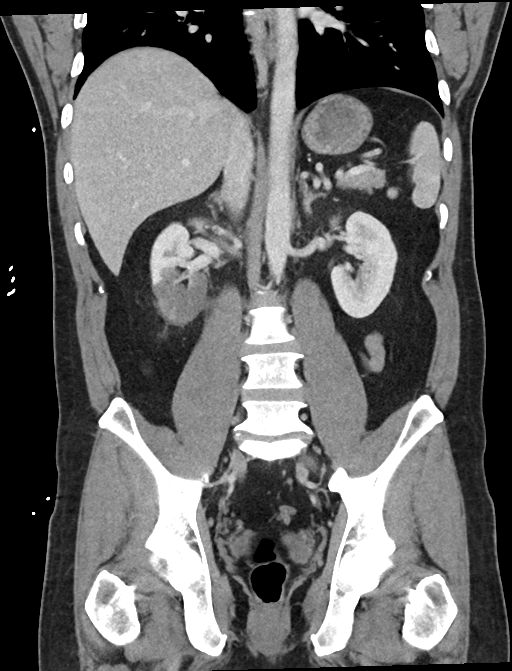
[im 52/93  soft-tissue]
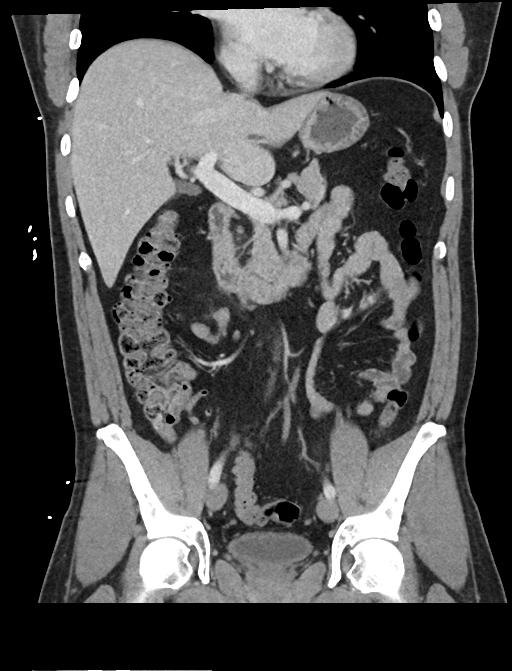

[15 of 46 positions shown; findings below may reference images not displayed]

FINDINGS: Lower chest: Clear lung bases. No significant pleural or pericardial
effusion.

Hepatobiliary: The liver is normal in density without suspicious
focal abnormality. No evidence of gallstones, gallbladder wall
thickening or biliary dilatation.

Pancreas: Unremarkable. No pancreatic ductal dilatation or
surrounding inflammatory changes.

Spleen: Normal in size without focal abnormality. Small splenules
are noted.

Adrenals/Urinary Tract: Both adrenal glands appear normal. There is
a somewhat wedge-shaped area of decreased attenuation involving the
lower pole of the right kidney anteriorly, measuring up to 4.3 x
cm transverse on image 47/2. This appearance is most consistent with
a subacute segmental infarct, and no mass lesion is identified.
There is a 1.3 cm simple cyst posteriorly in the lower pole of the
right kidney. There are probable additional tiny cysts bilaterally.
No evidence of urinary tract calculus or hydronephrosis. The bladder
appears unremarkable.

Stomach/Bowel: No enteric contrast administered. The stomach appears
unremarkable for its degree of distension. No evidence of bowel wall
thickening, distention or surrounding inflammatory change. The
appendix appears normal.

Vascular/Lymphatic: There are no enlarged abdominal or pelvic lymph
nodes. No significant vascular findings are identified.
Specifically, a single right renal artery and vein appear patent.
The IVC appears patent.

Reproductive: The prostate gland and seminal vesicles appear
unremarkable.

Other: No evidence of abdominal wall mass or hernia. No ascites.
Minimal perinephric soft tissue stranding inferiorly on the right
without focal fluid collection.

Musculoskeletal: No acute or significant osseous findings.
Degenerative disc disease at L5-S1.
IMPRESSION: 1. No evidence of acute appendicitis.
2. Abnormal appearance of the lower pole of the right kidney, most
consistent with a subacute segmental infarct. Focal pyelonephritis
less likely, and there is no focal fluid collection or urinary tract
obstruction. No associated large vessel occlusion identified.
Suggest urology follow-up.
3. Probable small renal cysts bilaterally.

## 2022-03-19 NOTE — Telephone Encounter (Signed)
Na

## 2022-07-22 ENCOUNTER — Telehealth: Payer: Self-pay

## 2022-07-22 NOTE — Telephone Encounter (Signed)
Transition Care Management Follow-up Telephone Call Date of discharge and from where: 07/18/22 [facility].Marland Kitchen Dx: Pylonephritis How have you been since you were released from the hospital? He's doing much better, per his wife. Any questions or concerns? No  Items Reviewed: Did the pt receive and understand the discharge instructions provided? No  Medications obtained and verified? No  Other? No  Any new allergies since your discharge? No  Dietary orders reviewed? No Do you have support at home? Yes   Home Care and Equipment/Supplies: Were home health services ordered? not applicable If so, what is the name of the agency? N/a  Has the agency set up a time to come to the patient's home? not applicable Were any new equipment or medical supplies ordered?  No What is the name of the medical supply agency? N/a Were you able to get the supplies/equipment? not applicable Do you have any questions related to the use of the equipment or supplies? No  Functional Questionnaire: (I = Independent and D = Dependent) ADLs: I  Bathing/Dressing- I  Meal Prep- I  Eating- I  Maintaining continence- I  Transferring/Ambulation- I  Managing Meds- I  Follow up appointments reviewed:  PCP Hospital f/u appt confirmed? No  Scheduled to see N/A on N/A @ N/A. Pt's wife states she would call back or have pt to call back to schedule appointment. Call made to pt, call disconnected, then sent to vm. [facility] f/u appt confirmed? No  Scheduled to see N/A on N/A @ N/A. Are transportation arrangements needed? No  If their condition worsens, is the pt aware to call PCP or go to the Emergency Dept.? Yes Was the patient provided with contact information for the PCP's office or ED? Yes Was to pt encouraged to call back with questions or concerns? Yes  Angeline Slim, RN, BSN RN Clinical Supervisor [facility] [employer]
